# Patient Record
Sex: Female | Born: 1965
Health system: Southern US, Community
[De-identification: ages and names within clinical notes are randomized; demographics above are authoritative.]

## PROBLEM LIST (undated history)

## (undated) DIAGNOSIS — K635 Polyp of colon: Secondary | ICD-10-CM

## (undated) HISTORY — DX: Polyp of colon: K63.5

## (undated) HISTORY — PX: WISDOM TOOTH EXTRACTION: SHX21

---

## 2018-10-19 DIAGNOSIS — Z01419 Encounter for gynecological examination (general) (routine) without abnormal findings: Secondary | ICD-10-CM | POA: Diagnosis not present

## 2018-10-19 DIAGNOSIS — Z1231 Encounter for screening mammogram for malignant neoplasm of breast: Secondary | ICD-10-CM | POA: Diagnosis not present

## 2018-10-19 DIAGNOSIS — Z1389 Encounter for screening for other disorder: Secondary | ICD-10-CM | POA: Diagnosis not present

## 2019-11-08 DIAGNOSIS — Z139 Encounter for screening, unspecified: Secondary | ICD-10-CM | POA: Diagnosis not present

## 2019-11-08 DIAGNOSIS — Z1321 Encounter for screening for nutritional disorder: Secondary | ICD-10-CM | POA: Diagnosis not present

## 2019-11-08 DIAGNOSIS — Z1231 Encounter for screening mammogram for malignant neoplasm of breast: Secondary | ICD-10-CM | POA: Diagnosis not present

## 2019-11-08 DIAGNOSIS — R140 Abdominal distension (gaseous): Secondary | ICD-10-CM | POA: Diagnosis not present

## 2019-11-08 DIAGNOSIS — Z7189 Other specified counseling: Secondary | ICD-10-CM | POA: Diagnosis not present

## 2019-11-08 DIAGNOSIS — Z01419 Encounter for gynecological examination (general) (routine) without abnormal findings: Secondary | ICD-10-CM | POA: Diagnosis not present

## 2019-11-08 DIAGNOSIS — Z1389 Encounter for screening for other disorder: Secondary | ICD-10-CM | POA: Diagnosis not present

## 2020-03-13 ENCOUNTER — Ambulatory Visit (INDEPENDENT_AMBULATORY_CARE_PROVIDER_SITE_OTHER): Payer: BC Managed Care – PPO | Admitting: Family Medicine

## 2020-03-13 ENCOUNTER — Other Ambulatory Visit: Payer: Self-pay

## 2020-03-13 ENCOUNTER — Encounter: Payer: Self-pay | Admitting: Family Medicine

## 2020-03-13 VITALS — BP 110/68 | HR 71 | Temp 97.5°F | Ht 62.5 in | Wt 202.0 lb

## 2020-03-13 DIAGNOSIS — R21 Rash and other nonspecific skin eruption: Secondary | ICD-10-CM | POA: Insufficient documentation

## 2020-03-13 DIAGNOSIS — F418 Other specified anxiety disorders: Secondary | ICD-10-CM

## 2020-03-13 DIAGNOSIS — Z7689 Persons encountering health services in other specified circumstances: Secondary | ICD-10-CM

## 2020-03-13 DIAGNOSIS — R7401 Elevation of levels of liver transaminase levels: Secondary | ICD-10-CM | POA: Diagnosis not present

## 2020-03-13 DIAGNOSIS — R202 Paresthesia of skin: Secondary | ICD-10-CM | POA: Diagnosis not present

## 2020-03-13 DIAGNOSIS — R748 Abnormal levels of other serum enzymes: Secondary | ICD-10-CM | POA: Diagnosis not present

## 2020-03-13 NOTE — Progress Notes (Signed)
Subjective:    Patient ID: Taffi Belgard, female    DOB: 1966/04/15, 54 y.o.   MRN: [id number]  HPI Chief Complaint  Patient presents with  . get established    get established, rash on legs- since early february- comes and goes, not a bad.    She is new to the practice and here to establish care. States she lives in [place] and does not have a PCP.   No regular medical care in years.   Here today with a "skin problem" since early February 2021.   Reports having intermittent rash on bilateral calves without itching.  States the rash only happens after she has been sitting in one particular room in her house.  It does not always happen.  If she does develop a rash then it typically goes away after 5 days without intervention.  States she does not have to use steroids or any topical treatment other than her regular lotion.  She does not have a picture of this. She questions whether she is contagious or not.  States she ordered clothing online in December 2020. States the delivery was "soaking wet", 2 pairs of jeans. States it was raining that day.  Ordered these from [employer].   She was hesitant to try them on and returned them without ever trying them on.   States she was just holding up the jeans and the plastic wrapping was touching her covered legs and she felt like something was biting her on her lower legs. States she had what seemed to be bites on her legs the next day.   States the area clear up quickly but it often can last up to 5 days. Last flareup was on 02/20/2020.   She denies having a rash or any skin issues today.  States her legs have been tingling.  She notices this when the rash is present.   Uses Lysol laundry sometimes but other times she only uses Tide clear and free.   States she sleeps on her couch. States she has insomnia and anxiety.  She has cleaned a lot since   She has discussed this with her exterminator.  Her husband is not having any issues.     States she is anxious about her health.  Denies history of anxiety unless it was related to her health. Denies depression.  Denies fever, chills, dizziness, chest pain, palpitations, shortness of breath, abdominal pain, N/V/D, urinary symptoms, LE edema.   LMP: December 2020 States she is not sexually active.  Thinks she may be going through menopause.  No other concerns today.  Review of Systems Pertinent positives and negatives in the history of present illness.     Objective:   Physical Exam BP 110/68   Pulse 71   Temp (!) 97.5 F (36.4 C)   Ht 5' 2.5" (1.588 m)   Wt 202 lb (91.6 kg)   LMP 11/16/2019   BMI 36.36 kg/m   Alert and in no distress.  Cardiac exam shows regular rate and rhythm and normal heart sounds.  Lungs are clear to auscultation.  Extremities without edema.  Skin is warm and dry, no rash.  She has good distal pulses, normal sensation and movement of her lower extremities and feet.  Negative Homans.       Assessment & Plan:  Complaint of rash  Paresthesias - Plan: CBC with Differential/Platelet, Comprehensive metabolic panel, TSH, T4, free, Vitamin B12  Anxiety about health - Plan: TSH, T4, free  Encounter  to establish care  She is a 54 year old female who is new to the practice and here to establish care.  Complains of an intermittent rash to her bilateral lower extremities after being in a particular room in her house.  The rash goes away without intervention.  She does not have a rash today and does not have a picture of her rash. Recommend that she look for triggers and avoid switching laundry detergents, soaps or lotions. Discussed returning with a picture or with an active rash so that we may get a better clue of what may be happening. She does have some tingling in her lower legs and no lab work in years.  I will check labs today and follow-up. Encouraged her to return for a fasting CPE so that we can get her caught up on preventive health

## 2020-03-14 ENCOUNTER — Encounter: Payer: Self-pay | Admitting: Family Medicine

## 2020-03-14 DIAGNOSIS — R7401 Elevation of levels of liver transaminase levels: Secondary | ICD-10-CM

## 2020-03-14 DIAGNOSIS — R748 Abnormal levels of other serum enzymes: Secondary | ICD-10-CM

## 2020-03-14 HISTORY — DX: Abnormal levels of other serum enzymes: R74.8

## 2020-03-14 HISTORY — DX: Elevation of levels of liver transaminase levels: R74.01

## 2020-03-14 LAB — CBC WITH DIFFERENTIAL/PLATELET
Basophils Absolute: 0.1 10*3/uL (ref 0.0–0.2)
Basos: 1 %
EOS (ABSOLUTE): 0.1 10*3/uL (ref 0.0–0.4)
Eos: 1 %
Hematocrit: 42.3 % (ref 34.0–46.6)
Hemoglobin: 14.1 g/dL (ref 11.1–15.9)
Immature Grans (Abs): 0 10*3/uL (ref 0.0–0.1)
Immature Granulocytes: 0 %
Lymphocytes Absolute: 2.5 10*3/uL (ref 0.7–3.1)
Lymphs: 23 %
MCH: 30.3 pg (ref 26.6–33.0)
MCHC: 33.3 g/dL (ref 31.5–35.7)
MCV: 91 fL (ref 79–97)
Monocytes Absolute: 0.7 10*3/uL (ref 0.1–0.9)
Monocytes: 7 %
Neutrophils Absolute: 7.5 10*3/uL — ABNORMAL HIGH (ref 1.4–7.0)
Neutrophils: 68 %
Platelets: 414 10*3/uL (ref 150–450)
RBC: 4.66 x10E6/uL (ref 3.77–5.28)
RDW: 12.9 % (ref 11.7–15.4)
WBC: 11 10*3/uL — ABNORMAL HIGH (ref 3.4–10.8)

## 2020-03-14 LAB — COMPREHENSIVE METABOLIC PANEL
ALT: 46 IU/L — ABNORMAL HIGH (ref 0–32)
AST: 33 IU/L (ref 0–40)
Albumin/Globulin Ratio: 1.4 (ref 1.2–2.2)
Albumin: 4.5 g/dL (ref 3.8–4.9)
Alkaline Phosphatase: 165 IU/L — ABNORMAL HIGH (ref 39–117)
BUN/Creatinine Ratio: 16 (ref 9–23)
BUN: 12 mg/dL (ref 6–24)
Bilirubin Total: 0.5 mg/dL (ref 0.0–1.2)
CO2: 22 mmol/L (ref 20–29)
Calcium: 10.1 mg/dL (ref 8.7–10.2)
Chloride: 103 mmol/L (ref 96–106)
Creatinine, Ser: 0.73 mg/dL (ref 0.57–1.00)
GFR calc Af Amer: 110 mL/min/{1.73_m2} (ref >59)
GFR calc non Af Amer: 96 mL/min/{1.73_m2} (ref >59)
Globulin, Total: 3.2 g/dL (ref 1.5–4.5)
Glucose: 98 mg/dL (ref 65–99)
Potassium: 4.1 mmol/L (ref 3.5–5.2)
Sodium: 141 mmol/L (ref 134–144)
Total Protein: 7.7 g/dL (ref 6.0–8.5)

## 2020-03-14 LAB — VITAMIN B12: Vitamin B-12: 415 pg/mL (ref 232–1245)

## 2020-03-14 LAB — T4, FREE: Free T4: 1.01 ng/dL (ref 0.82–1.77)

## 2020-03-14 LAB — TSH: TSH: 2.85 u[IU]/mL (ref 0.450–4.500)

## 2020-03-14 NOTE — Progress Notes (Signed)
Please see if Verdis Frederickson can add an acute hepatitis panel due to elevated ALT and fractionate the alkaline phosphatase due to elevated alk phos.

## 2020-03-16 ENCOUNTER — Encounter: Payer: Self-pay | Admitting: Family Medicine

## 2020-03-16 ENCOUNTER — Ambulatory Visit (INDEPENDENT_AMBULATORY_CARE_PROVIDER_SITE_OTHER): Payer: BC Managed Care – PPO | Admitting: Family Medicine

## 2020-03-16 ENCOUNTER — Other Ambulatory Visit: Payer: Self-pay

## 2020-03-16 VITALS — BP 120/72 | HR 62 | Temp 97.7°F

## 2020-03-16 DIAGNOSIS — R7401 Elevation of levels of liver transaminase levels: Secondary | ICD-10-CM

## 2020-03-16 DIAGNOSIS — R748 Abnormal levels of other serum enzymes: Secondary | ICD-10-CM

## 2020-03-16 DIAGNOSIS — R21 Rash and other nonspecific skin eruption: Secondary | ICD-10-CM | POA: Diagnosis not present

## 2020-03-16 DIAGNOSIS — R899 Unspecified abnormal finding in specimens from other organs, systems and tissues: Secondary | ICD-10-CM

## 2020-03-16 NOTE — Progress Notes (Signed)
These were discussed in person

## 2020-03-16 NOTE — Progress Notes (Signed)
   Subjective:    Patient ID: Heather Whitney, female    DOB: Jul 25, 1966, 54 y.o.   MRN: [id number]  HPI Chief Complaint  Patient presents with  . rash on leg    rash on legs   She is here with complaints of a non pruritic rash which returned on her lower legs yesterday.  She sent me a picture via email which I did view. She had several red, flat non specific types of dots on her left lower leg. States it is already fading again. No known triggers. She has not seen any insects or bugs. No one else in her house has this problem. 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   No rash elsewhere. No arthralgias or myalgias.   Discussed abnormal labs.  Mildly elevated alk phos and I am awaiting fractionation of this.  Elevated ALT   She takes Advil only once weekly  No alcohol use.   Reviewed allergies, medications, past medical, surgical, family, and social history.    Review of Systems Pertinent positives and negatives in the history of present illness.     Objective:   Physical Exam BP 120/72   Pulse 62   Temp 97.7 F (36.5 C)   Alert and oriented and in no acute distress.  Left lower leg with 4-5 small areas of red, flat, nonpruritic spots.  They have faded compared to the picture of them yesterday.      Assessment & Plan:  Rash and nonspecific skin eruption  Abnormal laboratory test result  Elevated alkaline phosphatase level - Plan: Comprehensive metabolic panel, Acute Hep Panel & Hep B Surface Ab  Elevated ALT measurement - Plan: Comprehensive metabolic panel, Acute Hep Panel & Hep B Surface Ab  Discussed that the rash is already fading.  I do not suspect that this is an insect or bug bite.  Suspect that she is coming in contact with something that is causing this.  Recommend that she keep an eye on any possible triggers.  She does plan to  see a dermatologist in the next few weeks as well.  Encouraged her to take a picture each time she had an outbreak.  We discussed abnormal lab values in person. Her ALT and alkaline phosphatase are mildly elevated.  I will check these again and also check an acute hepatitis panel.  I am also waiting for the results of her fractionated alk phos from 2 days ago. She will follow-up for a CPE at her convenience

## 2020-03-17 LAB — COMPREHENSIVE METABOLIC PANEL
ALT: 63 IU/L — ABNORMAL HIGH (ref 0–32)
AST: 37 IU/L (ref 0–40)
Albumin/Globulin Ratio: 1.4 (ref 1.2–2.2)
Albumin: 4.3 g/dL (ref 3.8–4.9)
Alkaline Phosphatase: 159 IU/L — ABNORMAL HIGH (ref 39–117)
BUN/Creatinine Ratio: 13 (ref 9–23)
BUN: 10 mg/dL (ref 6–24)
Bilirubin Total: 0.4 mg/dL (ref 0.0–1.2)
CO2: 22 mmol/L (ref 20–29)
Calcium: 9.6 mg/dL (ref 8.7–10.2)
Chloride: 105 mmol/L (ref 96–106)
Creatinine, Ser: 0.76 mg/dL (ref 0.57–1.00)
GFR calc Af Amer: 105 mL/min/{1.73_m2} (ref >59)
GFR calc non Af Amer: 91 mL/min/{1.73_m2} (ref >59)
Globulin, Total: 3 g/dL (ref 1.5–4.5)
Glucose: 93 mg/dL (ref 65–99)
Potassium: 4 mmol/L (ref 3.5–5.2)
Sodium: 142 mmol/L (ref 134–144)
Total Protein: 7.3 g/dL (ref 6.0–8.5)

## 2020-03-17 LAB — ACUTE HEP PANEL AND HEP B SURFACE AB
Hep A IgM: NEGATIVE
Hep B C IgM: NEGATIVE
Hep C Virus Ab: <0.1 s/co ratio (ref 0.0–0.9)
Hepatitis B Surf Ab Quant: <3.1 m[IU]/mL — ABNORMAL LOW (ref >9.9)
Hepatitis B Surface Ag: NEGATIVE

## 2020-03-20 LAB — HEPATITIS PANEL, ACUTE

## 2020-03-20 LAB — SPECIMEN STATUS REPORT

## 2020-03-20 LAB — ALKALINE PHOSPHATASE, ISOENZYMES: Alkaline Phosphatase: 160 IU/L — ABNORMAL HIGH (ref 39–117)

## 2020-03-21 NOTE — Progress Notes (Signed)
She is not immune to hepatitis B. I recommend that she get the hepatitis B vaccine series to protect her in the future.

## 2020-03-22 ENCOUNTER — Other Ambulatory Visit: Payer: Self-pay

## 2020-03-22 ENCOUNTER — Other Ambulatory Visit (INDEPENDENT_AMBULATORY_CARE_PROVIDER_SITE_OTHER): Payer: BC Managed Care – PPO

## 2020-03-22 DIAGNOSIS — Z23 Encounter for immunization: Secondary | ICD-10-CM

## 2020-03-25 LAB — ALKALINE PHOSPHATASE, ISOENZYMES
Alkaline Phosphatase: 161 IU/L — ABNORMAL HIGH (ref 39–117)
BONE FRACTION: 32 % (ref 14–68)
INTESTINAL FRAC.: 0 % (ref 0–18)
LIVER FRACTION: 68 % (ref 18–85)

## 2020-03-25 LAB — SPECIMEN STATUS REPORT

## 2020-03-26 NOTE — Progress Notes (Signed)
Her alkaline phosphatase lab value is elevated and the labs did not show a specific reason. I recommend she return in 1-2 months for a fasting CPE and we will recheck her liver tests

## 2020-03-28 DIAGNOSIS — L820 Inflamed seborrheic keratosis: Secondary | ICD-10-CM | POA: Diagnosis not present

## 2020-03-28 DIAGNOSIS — L918 Other hypertrophic disorders of the skin: Secondary | ICD-10-CM | POA: Diagnosis not present

## 2020-03-28 DIAGNOSIS — I872 Venous insufficiency (chronic) (peripheral): Secondary | ICD-10-CM | POA: Diagnosis not present

## 2020-04-24 ENCOUNTER — Other Ambulatory Visit (INDEPENDENT_AMBULATORY_CARE_PROVIDER_SITE_OTHER): Payer: BC Managed Care – PPO

## 2020-04-24 ENCOUNTER — Other Ambulatory Visit: Payer: BC Managed Care – PPO

## 2020-04-24 ENCOUNTER — Other Ambulatory Visit: Payer: Self-pay

## 2020-04-24 DIAGNOSIS — Z23 Encounter for immunization: Secondary | ICD-10-CM | POA: Diagnosis not present

## 2020-04-24 DIAGNOSIS — L739 Follicular disorder, unspecified: Secondary | ICD-10-CM | POA: Diagnosis not present

## 2020-04-25 NOTE — Patient Instructions (Addendum)
Obgyn Offices:   [facility] [address] [place], [place] [phone]  [facility] Address: [address] [place], [place] [address] Phone: [phone]  [facility] [address] [place], [place] [address] Phone: [phone]  [place], [place] [address] Phone: [phone]  Return for your Tdap (Tetanus, diptheria, and pertussis) vaccine at least one month after your upcoming Covid vaccine.    Preventive Care 82-52 Years Old, Female Preventive care refers to visits with your health care provider and lifestyle choices that can promote health and wellness. This includes:  A yearly physical exam. This may also be called an annual well check.  Regular dental visits and eye exams.  Immunizations.  Screening for certain conditions.  Healthy lifestyle choices, such as eating a healthy diet, getting regular exercise, not using drugs or products that contain nicotine and tobacco, and limiting alcohol use. What can I expect for my preventive care visit? Physical exam Your health care provider will check your:  Height and weight. This may be used to calculate body mass index (BMI), which tells if you are at a healthy weight.  Heart rate and blood pressure.  Skin for abnormal spots. Counseling Your health care provider may ask you questions about your:  Alcohol, tobacco, and drug use.  Emotional well-being.  Home and relationship well-being.  Sexual activity.  Eating habits.  Work and work Statistician.  Method of birth control.  Menstrual cycle.  Pregnancy history. What immunizations do I need?  Influenza (flu) vaccine  This is recommended every year. Tetanus, diphtheria, and pertussis (Tdap) vaccine  You may need a Td booster every 10 years. Varicella (chickenpox) vaccine  You may need this if you have not been  vaccinated. Zoster (shingles) vaccine  You may need this after age 8. Measles, mumps, and rubella (MMR) vaccine  You may need at least one dose of MMR if you were born in 1957 or later. You may also need a second dose. Pneumococcal conjugate (PCV13) vaccine  You may need this if you have certain conditions and were not previously vaccinated. Pneumococcal polysaccharide (PPSV23) vaccine  You may need one or two doses if you smoke cigarettes or if you have certain conditions. Meningococcal conjugate (MenACWY) vaccine  You may need this if you have certain conditions. Hepatitis A vaccine  You may need this if you have certain conditions or if you travel or work in places where you may be exposed to hepatitis A. Hepatitis B vaccine  You may need this if you have certain conditions or if you travel or work in places where you may be exposed to hepatitis B. Haemophilus influenzae type b (Hib) vaccine  You may need this if you have certain conditions. Human papillomavirus (HPV) vaccine  If recommended by your health care provider, you may need three doses over 6 months. You may receive vaccines as individual doses or as more than one vaccine together in one shot (combination vaccines). Talk with your health care provider about the risks and benefits of combination vaccines. What tests do I need? Blood tests  Lipid and cholesterol levels. These may be checked every 5 years, or more frequently if you are over 67 years old.  Hepatitis C test.  Hepatitis B test. Screening  Lung cancer screening. You may have this screening every year starting at age 28 if you have a 30-pack-year history of smoking and currently smoke or  have quit within the past 15 years.  Colorectal cancer screening. All adults should have this screening starting at age 67 and continuing until age 72. Your health care provider may recommend screening at age 16 if you are at increased risk. You will have tests every  1-10 years, depending on your results and the type of screening test.  Diabetes screening. This is done by checking your blood sugar (glucose) after you have not eaten for a while (fasting). You may have this done every 1-3 years.  Mammogram. This may be done every 1-2 years. Talk with your health care provider about when you should start having regular mammograms. This may depend on whether you have a family history of breast cancer.  BRCA-related cancer screening. This may be done if you have a family history of breast, ovarian, tubal, or peritoneal cancers.  Pelvic exam and Pap test. This may be done every 3 years starting at age 24. Starting at age 34, this may be done every 5 years if you have a Pap test in combination with an HPV test. Other tests  Sexually transmitted disease (STD) testing.  Bone density scan. This is done to screen for osteoporosis. You may have this scan if you are at high risk for osteoporosis. Follow these instructions at home: Eating and drinking  Eat a diet that includes fresh fruits and vegetables, whole grains, lean protein, and low-fat dairy.  Take vitamin and mineral supplements as recommended by your health care provider.  Do not drink alcohol if: ? Your health care provider tells you not to drink. ? You are pregnant, may be pregnant, or are planning to become pregnant.  If you drink alcohol: ? Limit how much you have to 0-1 drink a day. ? Be aware of how much alcohol is in your drink. In the [place] one drink equals one 12 oz bottle of beer (355 mL), one 5 oz glass of wine (148 mL), or one 1 oz glass of hard liquor (44 mL). Lifestyle  Take daily care of your teeth and gums.  Stay active. Exercise for at least 30 minutes on 5 or more days each week.  Do not use any products that contain nicotine or tobacco, such as cigarettes, e-cigarettes, and chewing tobacco. If you need help quitting, ask your health care provider.  If you are sexually active,  practice safe sex. Use a condom or other form of birth control (contraception) in order to prevent pregnancy and STIs (sexually transmitted infections).  If told by your health care provider, take low-dose aspirin daily starting at age 44. What's next?  Visit your health care provider once a year for a well check visit.  Ask your health care provider how often you should have your eyes and teeth checked.  Stay up to date on all vaccines. This information is not intended to replace advice given to you by your health care provider. Make sure you discuss any questions you have with your health care provider. Document Revised: 08/13/2018 Document Reviewed: 08/13/2018 Elsevier Patient Education  2020 [employer].

## 2020-04-25 NOTE — Progress Notes (Signed)
Subjective:    Patient ID: Heather Whitney, female    DOB: 27-Jun-1966, 54 y.o.   MRN: [id number]  HPI Chief Complaint  Patient presents with  . fasting cpe    fasting cpe, has obgyn, no other concerns   She is here for a complete physical exam. Last CPE: 10 years ago  Deficient in areas of preventive health.   Other providers: OB/GYN  -In [place].   She was recently found to not have immunity to hepatitis B and is in the process of repeating the series. Elevated ALT and alk phos with neg acute hepatitis panel.   States she takes Advil 1 tablet approximately 2 per week total on average.  Drinks 3 alcoholic beverages per year typically   States she has a history of vitamin D def. Is not currently taking a supplement.   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  Diet: fairly healthy. Drinking more water. Drinks soda.  Excerise: none. Plans to start walking soon.   Immunizations: J & J vaccine scheduled for June 1st.    Health maintenance:  Mammogram: November 2020  Colonoscopy: never  Last Gynecological Exam: November 2020  Last Menstrual cycle: December 2020  Has never been pregnant  Last Dental Exam: has appt tomorrow for cleaning  Last Eye Exam: March 29, 2020   Wears seatbelt always, uses sunscreen, smoke detectors in home and functioning, does not text while driving and feels safe in home environment.   Reviewed allergies, medications, past medical, surgical, family, and social history.  Review of Systems Review of Systems Constitutional: -fever, -chills, -sweats, -unexpected weight change,-fatigue ENT: -runny nose, -ear pain, -sore throat Cardiology:  -chest pain, -palpitations, -edema Respiratory: -cough, -shortness of breath, -wheezing Gastroenterology: -abdominal pain, -nausea, -vomiting, -diarrhea, -constipation  Hematology: -bleeding or bruising problems Musculoskeletal: -arthralgias, -myalgias, -joint  swelling, -back pain Ophthalmology: -vision changes Urology: -dysuria, -difficulty urinating, -hematuria, -urinary frequency, -urgency Neurology: -headache, -weakness, -tingling, -numbness       Objective:   Physical Exam BP 110/72   Pulse 68   Ht 5' 4"  (1.626 m)   Wt 200 lb 3.2 oz (90.8 kg)   SpO2 99%   BMI 34.36 kg/m   General Appearance:    Alert, cooperative, no distress, appears stated age  Head:    Normocephalic, without obvious abnormality, atraumatic  Eyes:    PERRL, conjunctiva/corneas clear, EOM's intact   Ears:    Normal TM's and external ear canals  Nose:   Mask in place   Throat:   Mask in place   Neck:   Supple, no lymphadenopathy;  thyroid:  no   enlargement/tenderness/nodules; no JVD  Back:    Spine nontender, no curvature, ROM normal, no CVA     tenderness  Lungs:     Clear to auscultation bilaterally without wheezes, rales or     ronchi; respirations unlabored  Chest Wall:    No tenderness or deformity   Heart:    Regular rate and rhythm, S1 and S2 normal, no murmur, rub   or gallop  Breast Exam:    OB/GYN  Abdomen:     Soft, non-tender, nondistended, normoactive bowel sounds,    no masses, no hepatosplenomegaly  Genitalia:    OB/GYN     Extremities:   No clubbing, cyanosis or edema  Pulses:   2+ and symmetric all extremities  Skin:   Skin color, texture, turgor normal, no rashes or lesions  Lymph  nodes:   Cervical, supraclavicular, and axillary nodes normal  Neurologic:   CNII-XII intact, normal strength, sensation and gait; reflexes 2+ and symmetric throughout          Psych:   Normal mood, affect, hygiene and grooming.         Assessment & Plan:  Routine general medical examination at a health care facility - Plan: Lipid panel -She is fairly new to me.  Here today for fasting CPE.  Preventive healthcare reviewed and she is deficient in several areas.  Reports being up-to-date with mammogram and Pap smear done at her OB/GYN office last year in  [place].  I will request these records.  She is never had a colonoscopy and I will refer her to GI.  Counseling on healthy lifestyle including diet and exercise.  Immunizations reviewed.  She plans to get a Covid vaccine in the next couple of weeks.  She is overdue for Tdap and will return for nurse visit at least 2 to 4 weeks after getting her last Covid vaccine.  Elevated alkaline phosphatase level - Plan: Ambulatory referral to Gastroenterology, Hepatic Function Panel -Normal fractionated alk phos.  Appreciate GI opinion regarding alk phos level.  Elevated ALT measurement - Plan: Ambulatory referral to Gastroenterology, Hepatic Function Panel -Denies regular NSAID or alcohol use.  Discussed low-fat diet and weight loss.  Appreciate GI opinion regarding elevated ALT.  Screen for colon cancer - Plan: Ambulatory referral to Gastroenterology -Referral made for her for screening colonoscopy per guidelines  Vitamin D deficiency - Plan: VITAMIN D 25 Hydroxy (Vit-D Deficiency, Fractures) -Check vitamin D level and start her on a vitamin D supplement as appropriate.  Obesity (BMI 30-39.9) -Counseling on healthy diet and exercise

## 2020-04-26 ENCOUNTER — Ambulatory Visit (INDEPENDENT_AMBULATORY_CARE_PROVIDER_SITE_OTHER): Payer: BC Managed Care – PPO | Admitting: Family Medicine

## 2020-04-26 ENCOUNTER — Other Ambulatory Visit: Payer: Self-pay

## 2020-04-26 ENCOUNTER — Encounter: Payer: Self-pay | Admitting: Family Medicine

## 2020-04-26 VITALS — BP 110/72 | HR 68 | Ht 64.0 in | Wt 200.2 lb

## 2020-04-26 DIAGNOSIS — Z0000 Encounter for general adult medical examination without abnormal findings: Secondary | ICD-10-CM | POA: Diagnosis not present

## 2020-04-26 DIAGNOSIS — R7401 Elevation of levels of liver transaminase levels: Secondary | ICD-10-CM | POA: Diagnosis not present

## 2020-04-26 DIAGNOSIS — E559 Vitamin D deficiency, unspecified: Secondary | ICD-10-CM | POA: Diagnosis not present

## 2020-04-26 DIAGNOSIS — Z1211 Encounter for screening for malignant neoplasm of colon: Secondary | ICD-10-CM

## 2020-04-26 DIAGNOSIS — R748 Abnormal levels of other serum enzymes: Secondary | ICD-10-CM

## 2020-04-26 DIAGNOSIS — E669 Obesity, unspecified: Secondary | ICD-10-CM

## 2020-04-27 ENCOUNTER — Telehealth: Payer: Self-pay | Admitting: Internal Medicine

## 2020-04-27 LAB — LIPID PANEL
Chol/HDL Ratio: 3.9 ratio (ref 0.0–4.4)
Cholesterol, Total: 181 mg/dL (ref 100–199)
HDL: 47 mg/dL (ref >39)
LDL Chol Calc (NIH): 119 mg/dL — ABNORMAL HIGH (ref 0–99)
Triglycerides: 78 mg/dL (ref 0–149)
VLDL Cholesterol Cal: 15 mg/dL (ref 5–40)

## 2020-04-27 LAB — HEPATIC FUNCTION PANEL
ALT: 33 IU/L — ABNORMAL HIGH (ref 0–32)
AST: 26 IU/L (ref 0–40)
Albumin: 4.2 g/dL (ref 3.8–4.9)
Alkaline Phosphatase: 180 IU/L — ABNORMAL HIGH (ref 39–117)
Bilirubin Total: 0.3 mg/dL (ref 0.0–1.2)
Bilirubin, Direct: 0.09 mg/dL (ref 0.00–0.40)
Total Protein: 7.3 g/dL (ref 6.0–8.5)

## 2020-04-27 LAB — VITAMIN D 25 HYDROXY (VIT D DEFICIENCY, FRACTURES): Vit D, 25-Hydroxy: 35.4 ng/mL (ref 30.0–100.0)

## 2020-04-27 NOTE — Telephone Encounter (Signed)
Pt called and states she did not sign a records release for obgyn- PAP or MAMMOGRAM as she does not want [place] to have records of that and she does not want to be asked for it in the future as she will not release that information to [place]. I advised her that we would need to close the gaps in her charts and she still refused. FYI

## 2020-04-27 NOTE — Telephone Encounter (Signed)
I will not be able to close gaps and ensure she is up to date on cancer screenings. Ok to document that these are done and that she reports them as normal.

## 2020-04-27 NOTE — Telephone Encounter (Signed)
Please advise as to your thoughts regarding patient not wanting to release medical records for her Pap smear and mammogram to me.  I recommend that we get her back for a Pap smear and send her for mammogram or I can give her a list of OB/GYN offices if she prefers to make sure she is up-to-date with her cancer screening.

## 2020-04-27 NOTE — Progress Notes (Signed)
Please schedule her for a RUQ [place] due to elevated liver function tests. Also, I need her to see GI for her elevated alkaline phosphatase and not just for a screening colonoscopy. I referred her yesterday. You may want to see if she is agreeable to getting the liver US before scheduling it. Thanks.  Her vitamin D is in low normal range. I recommend taking a multi-vitamin with D.

## 2020-04-27 NOTE — Telephone Encounter (Signed)
Pt states she has an obgyn and was up to date on her pap smear and she had her mammogram done at obgyn office

## 2020-04-27 NOTE — Telephone Encounter (Signed)
This has already been noted in health maintenance

## 2020-05-11 ENCOUNTER — Encounter: Payer: Self-pay | Admitting: Gastroenterology

## 2020-05-12 ENCOUNTER — Encounter: Payer: Self-pay | Admitting: Gastroenterology

## 2020-07-05 ENCOUNTER — Ambulatory Visit (INDEPENDENT_AMBULATORY_CARE_PROVIDER_SITE_OTHER): Payer: BC Managed Care – PPO | Admitting: Gastroenterology

## 2020-07-05 ENCOUNTER — Encounter: Payer: Self-pay | Admitting: Gastroenterology

## 2020-07-05 ENCOUNTER — Other Ambulatory Visit (INDEPENDENT_AMBULATORY_CARE_PROVIDER_SITE_OTHER): Payer: BC Managed Care – PPO

## 2020-07-05 VITALS — BP 128/62 | HR 65 | Ht 64.0 in | Wt 202.0 lb

## 2020-07-05 DIAGNOSIS — R7989 Other specified abnormal findings of blood chemistry: Secondary | ICD-10-CM

## 2020-07-05 DIAGNOSIS — Z1211 Encounter for screening for malignant neoplasm of colon: Secondary | ICD-10-CM

## 2020-07-05 DIAGNOSIS — Z7689 Persons encountering health services in other specified circumstances: Secondary | ICD-10-CM

## 2020-07-05 LAB — GLUCOSE, RANDOM: Glucose, Bld: 100 mg/dL — ABNORMAL HIGH (ref 70–99)

## 2020-07-05 LAB — IRON: Iron: 52 ug/dL (ref 42–145)

## 2020-07-05 LAB — FERRITIN: Ferritin: 89.8 ng/mL (ref 10.0–291.0)

## 2020-07-05 MED ORDER — SUPREP BOWEL PREP KIT 17.5-3.13-1.6 GM/177ML PO SOLN
1.0000 | ORAL | 0 refills | Status: DC
Start: 2020-07-05 — End: 2020-07-12

## 2020-07-05 NOTE — Patient Instructions (Signed)
Your provider has requested that you go to the basement level for lab work before leaving today. Press "B" on the elevator. The lab is located at the first door on the left as you exit the elevator.  You have been scheduled for an abdominal ultrasound at [facility] (1st floor of hospital) on 07/10/20 at 10:00am. Please arrive 15 minutes prior to your appointment for registration. Make certain not to have anything to eat or drink after midnight the night before your appointment. Should you need to reschedule your appointment, please contact radiology at [phone]. This test typically takes about 30 minutes to perform.  You have been scheduled for a colonoscopy. Please follow written instructions given to you at your visit today.  Please pick up your prep supplies at the pharmacy within the next 1-3 days. If you use inhalers (even only as needed), please bring them with you on the day of your procedure.  If you are age 52 or older, your body mass index should be between 23-30. Your Body mass index is 34.67 kg/m. If this is out of the aforementioned range listed, please consider follow up with your Primary Care Provider.  If you are age 59 or younger, your body mass index should be between 19-25. Your Body mass index is 34.67 kg/m. If this is out of the aformentioned range listed, please consider follow up with your Primary Care Provider.   We have placed a referral for primary care. Their office will contact you with an appointment.   Thank you for choosing me and [facility].  Dr.Kimberly Beavers

## 2020-07-05 NOTE — Progress Notes (Signed)
Referring Provider: Girtha Rm, NP-C Primary Care Physician:  Patient, No Pcp Per  Reason for Consultation:  Elevated LFTs   IMPRESSION:  Abnormal liver enzymes - elevated alk phos and ALT    - normal alk phos isoenzymes Bloating No prior colon cancer screening No prior family history of colon cancer or polyps  Abnormal liver enzymes: Suspected fatty liver. Labs today to evaluate for other common causes of hepatocellular and cholestatic inflammation. Abdominal ultrasound to evaluate the hepatic parenchyma.  Colonoscopy recommended for colon cancer screening.     PLAN: - Fasting labs: Hepatitis C antibody, hepatitis B surface antigen, hepatitis B core antibody, fasting ferritin, fasting insulin, fasting glucose, iron, ANA, AMA, anti-smooth muscle antibody, IgG, IgM - Abdominal ultrasound - Screening colonoscopy - Referral to [facility] to establish PMD  Please see the "Patient Instructions" section for addition details about the plan.  HPI: Heather Whitney is a 54 y.o. female referred by NP [facility] for abnormal liver enzymes and colon cancer screening. The history is obtained through the patient and review of her electronic health record. Denies chronic medical problems.  Has gained weight during Covid.  Completed Covid June 2021.   Recently seen by NP [facility] for an acute rash vusut. During her evaluation, she was found to have elevated liver enzymes.  No associated symptoms.  No prior blood donation.  No prior blood transfusion.  No history of jaundice or scleral icterus.  No history of use or experimentation with IV or intranasal street drugs.  No history of autoimmune disease.  No family history of liver disease.  Recent labs: 03/13/20: AST 33, ALT 46, alk phos 165, TB 0.5 03/16/20: AST 37, ALT 63, alk phos 161, TB 0.4 03/16/20: alk phos isoenzymes normal with bone 32%, intestinal 0%, liver 68% 04/26/20: AST 26, ALT 33, alk phos 180, TB 0.3  No prior abdominal  imaging.   Reported bloating and reflux. Tries to follow a bland diet due to a long history of "sensitve stomach." Heartburn has improved. Previously used Prilosec OTC. Attributes bloating to soda. When she drinks water she feels better.   No change in bowel habits, diarrhea, constipation, blood in the stool.   No prior endoscopic evaluation.   No known family history of colon cancer or polyps. No family history of uterine/endometrial cancer, pancreatic cancer or gastric/stomach cancer.   Past Medical History:  Diagnosis Date  . Elevated alkaline phosphatase level 03/14/2020  . Elevated ALT measurement 03/14/2020    Past Surgical History:  Procedure Laterality Date  . WISDOM TOOTH EXTRACTION      No current outpatient medications on file.   No current facility-administered medications for this visit.    Allergies as of 07/05/2020  . (No Known Allergies)    Family History  Problem Relation Age of Onset  . Hypertension Paternal Grandmother     Social History   Socioeconomic History  . Marital status: Married    Spouse name: Not on file  . Number of children: Not on file  . Years of education: Not on file  . Highest education level: Not on file  Occupational History  . Not on file  Tobacco Use  . Smoking status: Never Smoker  . Smokeless tobacco: Never Used  Substance and Sexual Activity  . Alcohol use: Yes    Comment: occasional   . Drug use: Never  . Sexual activity: Not on file  Other Topics Concern  . Not on file  Social History Narrative  .  Not on file   Social Determinants of Health   Financial Resource Strain:   . Difficulty of Paying Living Expenses:   Food Insecurity:   . Worried About Charity fundraiser in the Last Year:   . Arboriculturist in the Last Year:   Transportation Needs:   . Film/video editor (Medical):   Marland Kitchen Lack of Transportation (Non-Medical):   Physical Activity:   . Days of Exercise per Week:   . Minutes of Exercise per  Session:   Stress:   . Feeling of Stress :   Social Connections:   . Frequency of Communication with Friends and Family:   . Frequency of Social Gatherings with Friends and Family:   . Attends Religious Services:   . Active Member of Clubs or Organizations:   . Attends Archivist Meetings:   Marland Kitchen Marital Status:   Intimate Partner Violence:   . Fear of Current or Ex-Partner:   . Emotionally Abused:   Marland Kitchen Physically Abused:   . Sexually Abused:     Review of Systems: 12 system ROS is negative except as noted above with the addition of allergies and fatigue.   Physical Exam: General:   Alert,  well-nourished, pleasant and cooperative in NAD Head:  Normocephalic and atraumatic. Eyes:  Sclera clear, no icterus.   Conjunctiva pink. Ears:  Normal auditory acuity. Nose:  No deformity, discharge,  or lesions. Mouth:  No deformity or lesions.   Neck:  Supple; no masses or thyromegaly. Lungs:  Clear throughout to auscultation.   No wheezes. Heart:  Regular rate and rhythm; no murmurs. Abdomen:  Soft,nontender, nondistended, normal bowel sounds, no rebound or guarding. No hepatosplenomegaly.   Rectal:  Deferred  Msk:  Symmetrical. No boney deformities LAD: No inguinal or umbilical LAD Extremities:  No clubbing or edema. Neurologic:  Alert and  oriented x4;  grossly nonfocal Skin:  Intact without significant lesions or rashes. Psych:  Alert and cooperative. Normal mood and affect.   Heather Whitney L. Tarri Glenn, MD, MPH 07/05/2020, 10:33 AM

## 2020-07-10 ENCOUNTER — Other Ambulatory Visit: Payer: Self-pay

## 2020-07-10 ENCOUNTER — Ambulatory Visit (HOSPITAL_COMMUNITY)
Admission: RE | Admit: 2020-07-10 | Discharge: 2020-07-10 | Disposition: A | Discharge status: Home / Self Care | Payer: BC Managed Care – PPO | Source: Ambulatory Visit | Attending: Gastroenterology | Admitting: Gastroenterology

## 2020-07-10 DIAGNOSIS — R7989 Other specified abnormal findings of blood chemistry: Secondary | ICD-10-CM

## 2020-07-10 DIAGNOSIS — Z1211 Encounter for screening for malignant neoplasm of colon: Secondary | ICD-10-CM | POA: Diagnosis not present

## 2020-07-10 DIAGNOSIS — Q8909 Congenital malformations of spleen: Secondary | ICD-10-CM | POA: Diagnosis not present

## 2020-07-10 DIAGNOSIS — K7689 Other specified diseases of liver: Secondary | ICD-10-CM | POA: Diagnosis not present

## 2020-07-10 DIAGNOSIS — D7389 Other diseases of spleen: Secondary | ICD-10-CM | POA: Diagnosis not present

## 2020-07-12 ENCOUNTER — Other Ambulatory Visit: Payer: Self-pay

## 2020-07-12 ENCOUNTER — Ambulatory Visit (AMBULATORY_SURGERY_CENTER): Payer: BC Managed Care – PPO | Admitting: Gastroenterology

## 2020-07-12 ENCOUNTER — Encounter: Payer: Self-pay | Admitting: Gastroenterology

## 2020-07-12 VITALS — BP 123/83 | HR 57 | Temp 97.5°F | Resp 24 | Ht 64.0 in | Wt 202.0 lb

## 2020-07-12 DIAGNOSIS — Z1211 Encounter for screening for malignant neoplasm of colon: Secondary | ICD-10-CM | POA: Diagnosis not present

## 2020-07-12 DIAGNOSIS — D120 Benign neoplasm of cecum: Secondary | ICD-10-CM

## 2020-07-12 DIAGNOSIS — R7989 Other specified abnormal findings of blood chemistry: Secondary | ICD-10-CM

## 2020-07-12 MED ORDER — SODIUM CHLORIDE 0.9 % IV SOLN: 500.0000 mL | Freq: Once | INTRAVENOUS | Status: DC

## 2020-07-12 NOTE — Progress Notes (Signed)
Report to PACU, RN, vss, BBS= Clear.  

## 2020-07-12 NOTE — Progress Notes (Signed)
Called to room to assist during endoscopic procedure.  Patient ID and intended procedure confirmed with present staff. Received instructions for my participation in the procedure from the performing physician.  

## 2020-07-12 NOTE — Op Note (Signed)
[place] Patient Name: Heather Whitney Procedure Date: 07/12/2020 11:08 AM MRN: [id number] Endoscopist: Thornton Park MD, MD Age: 54 Referring MD:  Date of Birth: 14-Feb-1966 Gender: Female Account #: [id number] Procedure:                Colonoscopy Indications:              Screening for colorectal malignant neoplasm, This                            is the patient's first colonoscopy                           No known family history of colon cancer or polyps Medicines:                Monitored Anesthesia Care Procedure:                Pre-Anesthesia Assessment:                           - Prior to the procedure, a History and Physical                            was performed, and patient medications and                            allergies were reviewed. The patient's tolerance of                            previous anesthesia was also reviewed. The risks                            and benefits of the procedure and the sedation                            options and risks were discussed with the patient.                            All questions were answered, and informed consent                            was obtained. Prior Anticoagulants: The patient has                            taken no previous anticoagulant or antiplatelet                            agents. ASA Grade Assessment: II - A patient with                            mild systemic disease. After reviewing the risks                            and benefits, the patient was deemed in  satisfactory condition to undergo the procedure.                           After obtaining informed consent, the colonoscope                            was passed under direct vision. Throughout the                            procedure, the patient's blood pressure, pulse, and                            oxygen saturations were monitored continuously. The                            Colonoscope was  introduced through the anus and                            advanced to the 3 cm into the ileum. The                            colonoscopy was performed without difficulty. The                            patient tolerated the procedure well. The quality                            of the bowel preparation was good. The terminal                            ileum, ileocecal valve, appendiceal orifice, and                            rectum were photographed. Scope In: 11:25:21 AM Scope Out: 11:39:26 AM Scope Withdrawal Time: 0 hours 9 minutes 19 seconds  Total Procedure Duration: 0 hours 14 minutes 5 seconds  Findings:                 The perianal and digital rectal examinations were                            normal.                           A 9 mm polyp was found in the cecum. The polyp was                            flat. The polyp was removed with a cold snare.                            Resection and retrieval were complete. Estimated                            blood loss was minimal.  The exam was otherwise without abnormality on                            direct and retroflexion views. Complications:            No immediate complications. Estimated blood loss:                            Minimal. Estimated Blood Loss:     Estimated blood loss was minimal. Impression:               - One 9-10 mm polyp in the cecum, removed with a                            cold snare. Resected and retrieved.                           - The examination was otherwise normal on direct                            and retroflexion views. Recommendation:           - Patient has a contact number available for                            emergencies. The signs and symptoms of potential                            delayed complications were discussed with the                            patient. Return to normal activities tomorrow.                            Written discharge instructions  were provided to the                            patient.                           - Resume previous diet.                           - Continue present medications.                           - Await pathology results.                           - Repeat colonoscopy date to be determined after                            pending pathology results are reviewed for                            surveillance.                           -  Emerging evidence supports eating a diet of                            fruits, vegetables, grains, calcium, and yogurt                            while reducing red meat and alcohol may reduce the                            risk of colon cancer.                           - Thank you for allowing me to be involved in your                            colon cancer prevention. Thornton Park MD, MD 07/12/2020 11:45:45 AM This report has been signed electronically.

## 2020-07-12 NOTE — Patient Instructions (Signed)
Handout on polyp given to you today  Await pathology results    YOU HAD AN ENDOSCOPIC PROCEDURE TODAY AT [facility]:   Refer to the procedure report that was given to you for any specific questions about what was found during the examination.  If the procedure report does not answer your questions, please call your gastroenterologist to clarify.  If you requested that your care partner not be given the details of your procedure findings, then the procedure report has been included in a sealed envelope for you to review at your convenience later.  YOU SHOULD EXPECT: Some feelings of bloating in the abdomen. Passage of more gas than usual.  Walking can help get rid of the air that was put into your GI tract during the procedure and reduce the bloating. If you had a lower endoscopy (such as a colonoscopy or flexible sigmoidoscopy) you may notice spotting of blood in your stool or on the toilet paper. If you underwent a bowel prep for your procedure, you may not have a normal bowel movement for a few days.  Please Note:  You might notice some irritation and congestion in your nose or some drainage.  This is from the oxygen used during your procedure.  There is no need for concern and it should clear up in a day or so.  SYMPTOMS TO REPORT IMMEDIATELY:   Following lower endoscopy (colonoscopy or flexible sigmoidoscopy):  Excessive amounts of blood in the stool  Significant tenderness or worsening of abdominal pains  Swelling of the abdomen that is new, acute  Fever of 100F or higher  For urgent or emergent issues, a gastroenterologist can be reached at any hour by calling [phone]. Do not use MyChart messaging for urgent concerns.    DIET:  We do recommend a small meal at first, but then you may proceed to your regular diet.  Drink plenty of fluids but you should avoid alcoholic beverages for 24 hours.  ACTIVITY:  You should plan to take it easy for the rest of today and  you should NOT DRIVE or use heavy machinery until tomorrow (because of the sedation medicines used during the test).    FOLLOW UP: Our staff will call the number listed on your records 48-72 hours following your procedure to check on you and address any questions or concerns that you may have regarding the information given to you following your procedure. If we do not reach you, we will leave a message.  We will attempt to reach you two times.  During this call, we will ask if you have developed any symptoms of COVID 19. If you develop any symptoms (ie: fever, flu-like symptoms, shortness of breath, cough etc.) before then, please call [phone].  If you test positive for Covid 19 in the 2 weeks post procedure, please call and report this information to [place].    If any biopsies were taken you will be contacted by phone or by letter within the next 1-3 weeks.  Please call us at [phone] if you have not heard about the biopsies in 3 weeks.    SIGNATURES/CONFIDENTIALITY: You and/or your care partner have signed paperwork which will be entered into your electronic medical record.  These signatures attest to the fact that that the information above on your After Visit Summary has been reviewed and is understood.  Full responsibility of the confidentiality of this discharge information lies with you and/or your care-partner.

## 2020-07-12 NOTE — Progress Notes (Signed)
Pt's states no medical or surgical changes since previsit or office visit.  Vitals- Courtney 

## 2020-07-13 LAB — HEPATITIS C ANTIBODY
Hepatitis C Ab: NONREACTIVE
SIGNAL TO CUT-OFF: 0.01 (ref <1.00)

## 2020-07-13 LAB — ANTI-SMOOTH MUSCLE ANTIBODY, IGG: Actin (Smooth Muscle) Antibody (IGG): <20 U (ref <20)

## 2020-07-13 LAB — ANTI-NUCLEAR AB-TITER (ANA TITER): ANA Titer 1: 1:80 {titer} — ABNORMAL HIGH

## 2020-07-13 LAB — ANA: Anti Nuclear Antibody (ANA): POSITIVE — AB

## 2020-07-13 LAB — IGG: IgG (Immunoglobin G), Serum: 1260 mg/dL (ref 600–1640)

## 2020-07-13 LAB — HEPATITIS B CORE ANTIBODY, TOTAL: Hep B Core Total Ab: NONREACTIVE

## 2020-07-13 LAB — HEPATITIS B SURFACE ANTIGEN: Hepatitis B Surface Ag: NONREACTIVE

## 2020-07-13 LAB — IGM: IgM, Serum: 82 mg/dL (ref 50–300)

## 2020-07-13 LAB — INSULIN, FREE (BIOACTIVE): Insulin, Free: 7.7 u[IU]/mL (ref 1.5–14.9)

## 2020-07-14 ENCOUNTER — Telehealth: Payer: Self-pay | Admitting: *Deleted

## 2020-07-14 ENCOUNTER — Telehealth: Payer: Self-pay

## 2020-07-14 NOTE — Telephone Encounter (Signed)
Left message on answering machine. 

## 2020-07-14 NOTE — Telephone Encounter (Signed)
  Follow up Call-  Call back number 07/12/2020  Post procedure Call Back phone  # [phone]  Permission to leave phone message Yes    LMOM to call back with any questions or concerns.  Also, call back if patient has developed fever, respiratory issues or been dx with COVID or had any family members or close contacts diagnosed since her procedure.

## 2020-08-10 ENCOUNTER — Encounter: Payer: Self-pay | Admitting: Gastroenterology

## 2020-09-29 ENCOUNTER — Encounter: Payer: Self-pay | Admitting: Internal Medicine

## 2020-11-13 DIAGNOSIS — Z1389 Encounter for screening for other disorder: Secondary | ICD-10-CM | POA: Diagnosis not present

## 2020-11-13 DIAGNOSIS — Z1231 Encounter for screening mammogram for malignant neoplasm of breast: Secondary | ICD-10-CM | POA: Diagnosis not present

## 2020-11-13 DIAGNOSIS — Z01419 Encounter for gynecological examination (general) (routine) without abnormal findings: Secondary | ICD-10-CM | POA: Diagnosis not present

## 2020-11-13 DIAGNOSIS — Z7189 Other specified counseling: Secondary | ICD-10-CM | POA: Diagnosis not present

## 2020-11-13 DIAGNOSIS — E559 Vitamin D deficiency, unspecified: Secondary | ICD-10-CM | POA: Diagnosis not present

## 2021-04-27 ENCOUNTER — Encounter: Payer: BC Managed Care – PPO | Admitting: Family Medicine

## 2021-05-09 ENCOUNTER — Encounter: Payer: BC Managed Care – PPO | Admitting: Family Medicine

## 2021-05-17 DIAGNOSIS — B351 Tinea unguium: Secondary | ICD-10-CM | POA: Diagnosis not present

## 2021-05-17 DIAGNOSIS — L602 Onychogryphosis: Secondary | ICD-10-CM | POA: Diagnosis not present

## 2021-05-17 DIAGNOSIS — L603 Nail dystrophy: Secondary | ICD-10-CM | POA: Diagnosis not present

## 2021-05-17 DIAGNOSIS — L608 Other nail disorders: Secondary | ICD-10-CM | POA: Diagnosis not present

## 2021-06-26 DIAGNOSIS — H9311 Tinnitus, right ear: Secondary | ICD-10-CM | POA: Diagnosis not present

## 2021-06-26 DIAGNOSIS — J309 Allergic rhinitis, unspecified: Secondary | ICD-10-CM | POA: Diagnosis not present

## 2021-06-26 DIAGNOSIS — H6121 Impacted cerumen, right ear: Secondary | ICD-10-CM | POA: Diagnosis not present

## 2021-08-08 DIAGNOSIS — E559 Vitamin D deficiency, unspecified: Secondary | ICD-10-CM | POA: Diagnosis not present

## 2021-08-14 DIAGNOSIS — H6981 Other specified disorders of Eustachian tube, right ear: Secondary | ICD-10-CM | POA: Diagnosis not present

## 2021-08-14 DIAGNOSIS — J309 Allergic rhinitis, unspecified: Secondary | ICD-10-CM | POA: Diagnosis not present

## 2021-10-06 IMAGING — US US ABDOMEN COMPLETE
1 series · 14 of 25 positions shown · non-contrast
Comparison: None.

CLINICAL DATA: Elevated LFTs.

EXAM:
ABDOMEN ULTRASOUND COMPLETE

[Series 1: us abdomen complete · 14 of 122 slices shown]
[im 1/122]
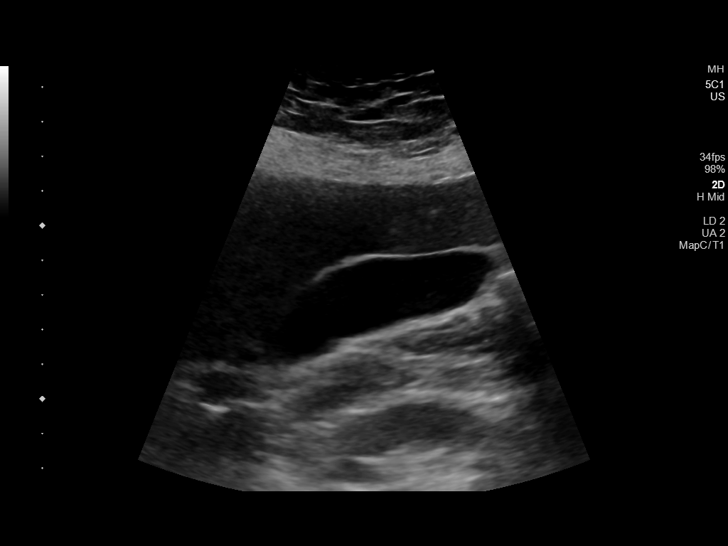
[im 11/122]
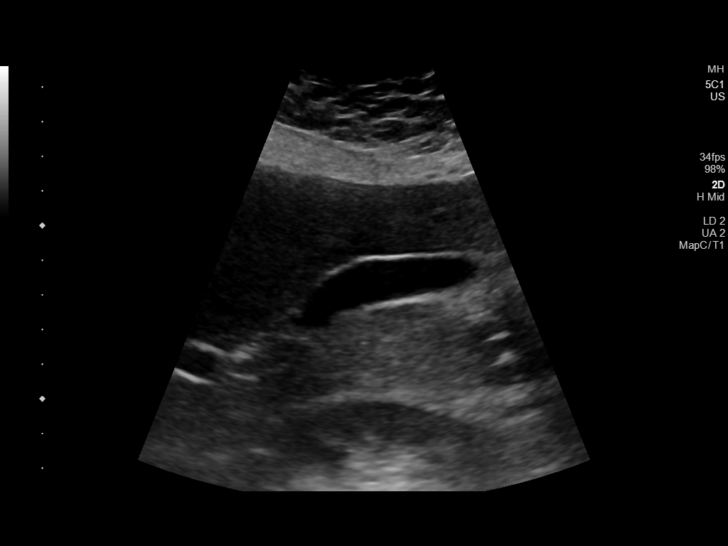
[im 21/122]
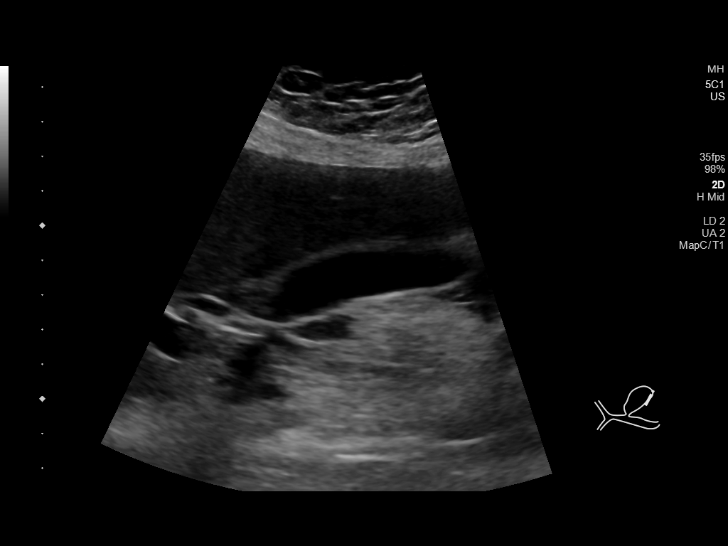
[im 31/122]
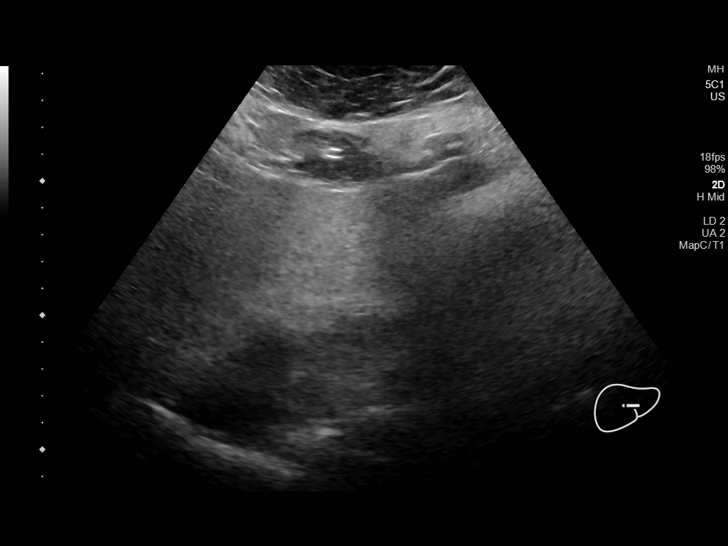
[im 41/122]
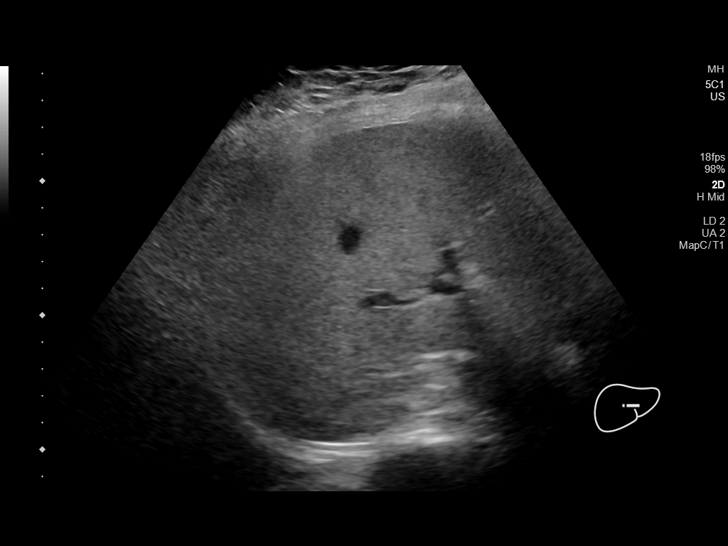
[im 46/122]
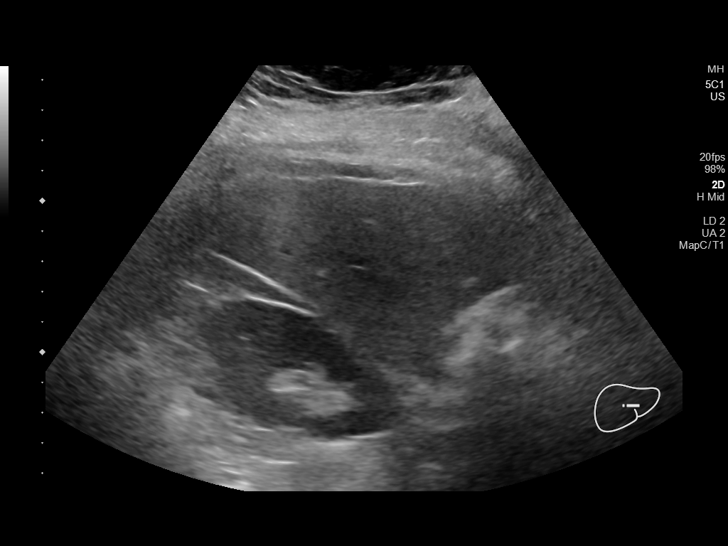
[im 56/122]
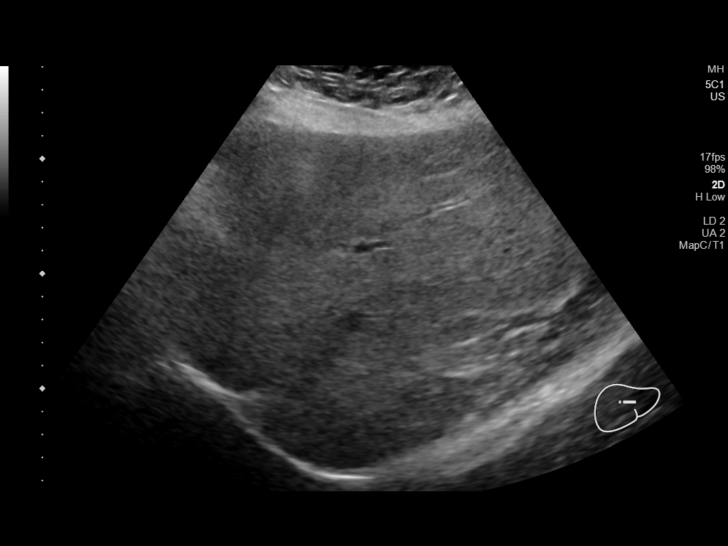
[im 66/122]
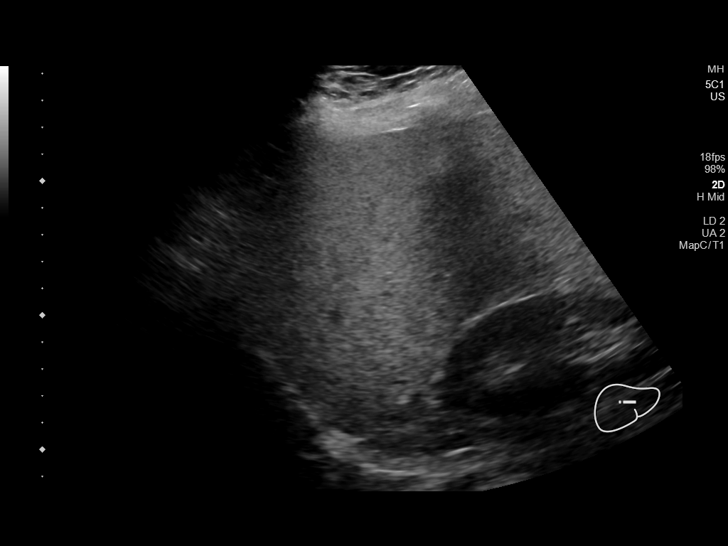
[im 76/122]
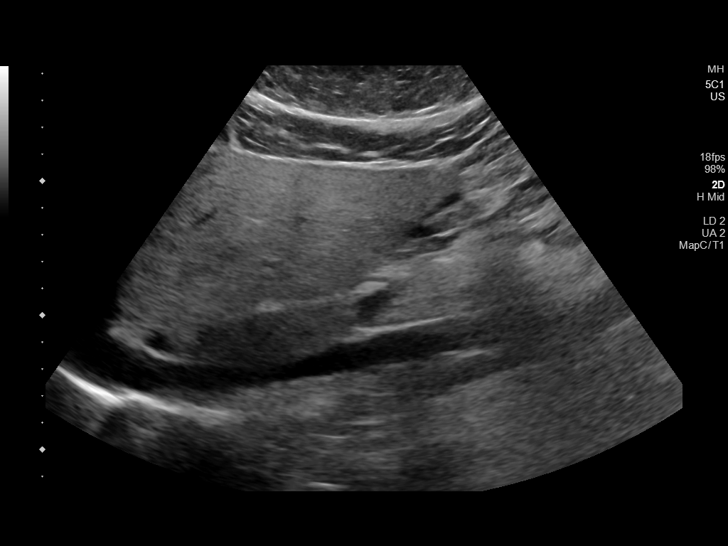
[im 81/122]
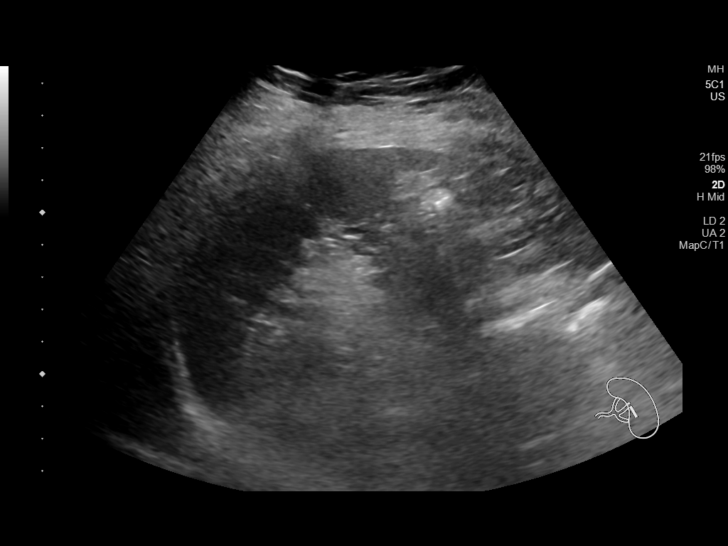
[im 91/122]
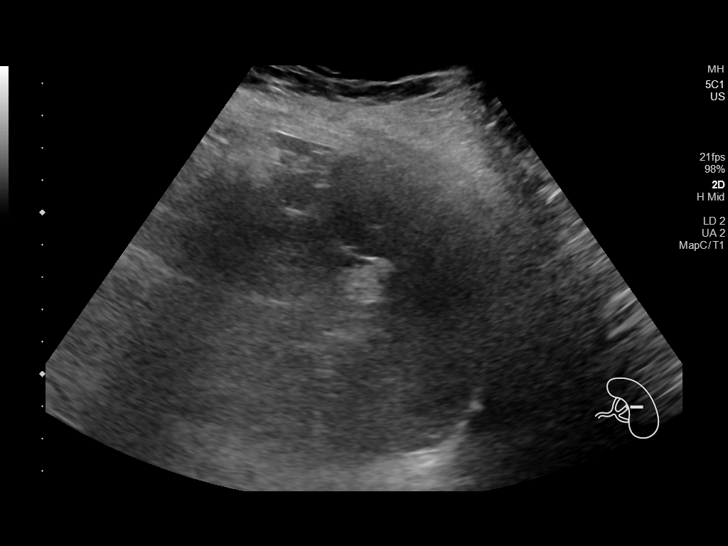
[im 101/122]
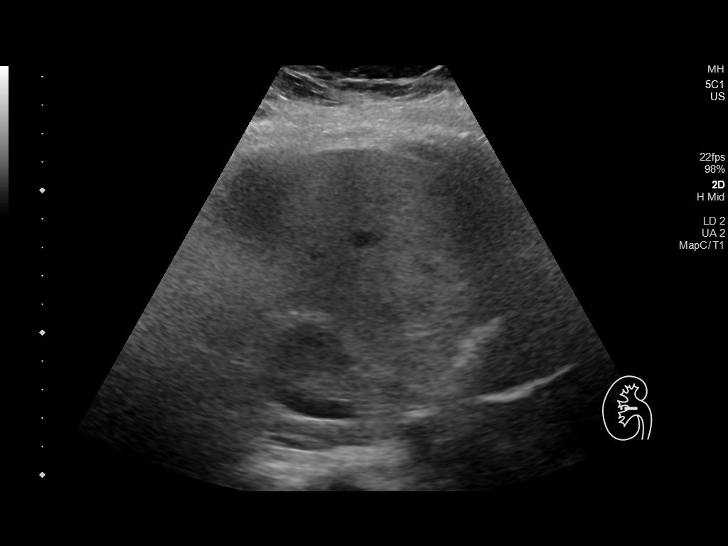
[im 111/122]
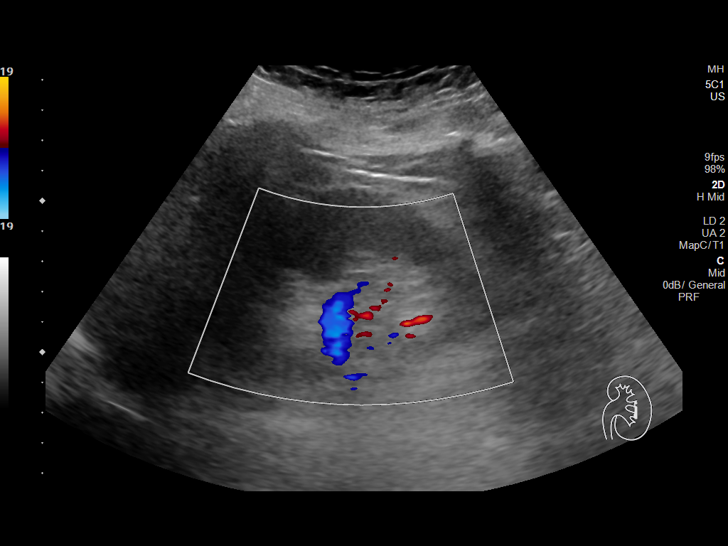
[im 122/122]
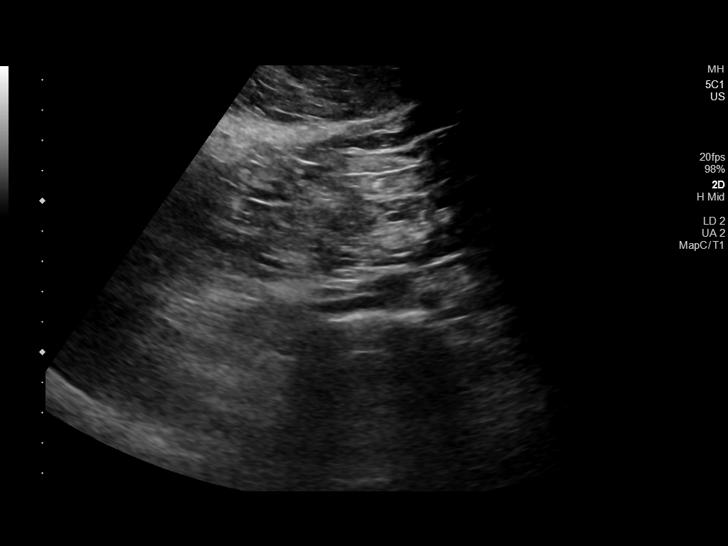

[14 of 25 positions shown; findings below may reference images not displayed]

FINDINGS: Gallbladder: No gallstones or wall thickening visualized. No
sonographic Murphy sign noted by sonographer.

Common bile duct: Diameter: 3.5 mm, within normal limits.

Liver: No focal lesion identified. Increased parenchymal
echogenicity. Portal vein is patent on color Doppler imaging with
normal direction of blood flow towards the liver.

IVC: No abnormality visualized.

Pancreas: Visualized portion unremarkable.

Spleen: Size and appearance within normal limits. Adjacent splenule.

Right Kidney: Length: 11.3 cm. Echogenicity within normal limits.
No mass or hydronephrosis visualized.

Left Kidney: Length: 10.5 cm. Echogenicity within normal limits. No
mass or hydronephrosis visualized.

Abdominal aorta: No aneurysm visualized.

Other findings: None.
IMPRESSION: Hepatic steatosis.  No focal hepatic lesion.

Otherwise unremarkable abdominal ultrasound.

## 2021-11-15 DIAGNOSIS — Z1231 Encounter for screening mammogram for malignant neoplasm of breast: Secondary | ICD-10-CM | POA: Diagnosis not present

## 2022-01-02 DIAGNOSIS — Z01419 Encounter for gynecological examination (general) (routine) without abnormal findings: Secondary | ICD-10-CM | POA: Diagnosis not present

## 2022-01-02 DIAGNOSIS — E559 Vitamin D deficiency, unspecified: Secondary | ICD-10-CM | POA: Diagnosis not present

## 2022-01-02 DIAGNOSIS — Z1389 Encounter for screening for other disorder: Secondary | ICD-10-CM | POA: Diagnosis not present

## 2022-07-09 DIAGNOSIS — B351 Tinea unguium: Secondary | ICD-10-CM | POA: Diagnosis not present

## 2022-07-17 ENCOUNTER — Ambulatory Visit
Admission: EM | Admit: 2022-07-17 | Discharge: 2022-07-17 | Disposition: A | Discharge status: Home / Self Care | Payer: BC Managed Care – PPO | Attending: Physician Assistant | Admitting: Physician Assistant

## 2022-07-17 DIAGNOSIS — H65191 Other acute nonsuppurative otitis media, right ear: Secondary | ICD-10-CM

## 2022-07-17 DIAGNOSIS — J0100 Acute maxillary sinusitis, unspecified: Secondary | ICD-10-CM | POA: Diagnosis not present

## 2022-07-17 MED ORDER — AMOXICILLIN-POT CLAVULANATE 875-125 MG PO TABS: 1.0000 | ORAL_TABLET | Freq: Two times a day (BID) | ORAL | 0 refills | Status: DC

## 2022-07-17 NOTE — ED Provider Notes (Signed)
[facility]    CSN: [id number] Arrival date & time: 07/17/22  1513      History   Chief Complaint Chief Complaint  Patient presents with   Otalgia    HPI Heather Whitney is a 56 y.o. female.   Patient here today for evaluation of sinus pressure, congestion and ear pain that started several weeks ago but that has seemed to worsen recently. She reports more discomfort in her right ear than left. She has not had fever. She denies sore throat or fever. She has not had cough. She has tried OTC medication without significant relief.   The history is provided by the patient.    Past Medical History:  Diagnosis Date   Elevated alkaline phosphatase level 03/14/2020   Elevated ALT measurement 03/14/2020    Patient Active Problem List   Diagnosis Date Noted   Vitamin D deficiency 04/26/2020   Obesity (BMI 30-39.9) 04/26/2020   Elevated ALT measurement 03/14/2020   Elevated alkaline phosphatase level 03/14/2020   Complaint of rash 03/13/2020   Paresthesias 03/13/2020    Past Surgical History:  Procedure Laterality Date   WISDOM TOOTH EXTRACTION      OB History   No obstetric history on file.      Home Medications    Prior to Admission medications   Medication Sig Start Date End Date Taking? Authorizing Provider  amoxicillin-clavulanate (AUGMENTIN) 875-125 MG tablet Take 1 tablet by mouth every 12 (twelve) hours. 07/17/22  Yes Francene Finders, PA-C    Family History Family History  Problem Relation Age of Onset   Hypertension Paternal Grandmother     Social History Social History   Tobacco Use   Smoking status: Never   Smokeless tobacco: Never  Substance Use Topics   Alcohol use: Yes    Comment: occasional    Drug use: Never     Allergies   Patient has no known allergies.   Review of Systems Review of Systems  Constitutional:  Negative for chills and fever.  HENT:  Positive for congestion, ear pain and sinus pressure. Negative for sore  throat.   Eyes:  Negative for discharge and redness.  Respiratory:  Negative for cough, shortness of breath and wheezing.   Gastrointestinal:  Negative for abdominal pain, diarrhea, nausea and vomiting.     Physical Exam Triage Vital Signs ED Triage Vitals  Enc Vitals Group     BP      Pulse      Resp      Temp      Temp src      SpO2      Weight      Height      Head Circumference      Peak Flow      Pain Score      Pain Loc      Pain Edu?      Excl. in [place]?    No data found.  Updated Vital Signs BP 136/84 (BP Location: Left Arm)   Pulse 65   Temp 97.9 F (36.6 C) (Oral)   Resp 18   LMP 11/16/2019   SpO2 96%      Physical Exam Vitals and nursing note reviewed.  Constitutional:      General: She is not in acute distress.    Appearance: Normal appearance. She is not ill-appearing.  HENT:     Head: Normocephalic and atraumatic.     Ears:     Comments: Bilateral Tms  dull, right TM bulging     Nose: Congestion present.     Mouth/Throat:     Mouth: Mucous membranes are moist.  Eyes:     Conjunctiva/sclera: Conjunctivae normal.  Cardiovascular:     Rate and Rhythm: Normal rate.  Pulmonary:     Effort: Pulmonary effort is normal. No respiratory distress.  Skin:    General: Skin is warm and dry.  Neurological:     Mental Status: She is alert.  Psychiatric:        Mood and Affect: Mood normal.        Thought Content: Thought content normal.      UC Treatments / Results  Labs (all labs ordered are listed, but only abnormal results are displayed) Labs Reviewed - No data to display  EKG   Radiology No results found.  Procedures Procedures (including critical care time)  Medications Ordered in UC Medications - No data to display  Initial Impression / Assessment and Plan / UC Course  I have reviewed the triage vital signs and the nursing notes.  Pertinent labs & imaging results that were available during my care of the patient were reviewed by  me and considered in my medical decision making (see chart for details).    Augmentin prescribed to cover both sinusitis and otitis media. Encouraged she use flonase that she reports she has at home and recommend follow up if no improvement with treatment or with any worsening.   Final Clinical Impressions(s) / UC Diagnoses   Final diagnoses:  Other acute nonsuppurative otitis media of right ear, recurrence not specified  Acute maxillary sinusitis, recurrence not specified   Discharge Instructions   None    ED Prescriptions     Medication Sig Dispense Auth. Provider   amoxicillin-clavulanate (AUGMENTIN) 875-125 MG tablet Take 1 tablet by mouth every 12 (twelve) hours. 14 tablet Francene Finders, PA-C      PDMP not reviewed this encounter.   Francene Finders, PA-C 07/17/22 1606

## 2022-07-17 NOTE — ED Triage Notes (Signed)
Pt c/o bilateral ear pain (aching), ear fullness, and sinus issues (congestion).  Home interventions: H[place]ands eardrops  Started: a few weeks ago

## 2022-10-07 ENCOUNTER — Ambulatory Visit
Admission: EM | Admit: 2022-10-07 | Discharge: 2022-10-07 | Disposition: A | Discharge status: Home / Self Care | Payer: BC Managed Care – PPO | Attending: Urgent Care | Admitting: Urgent Care

## 2022-10-07 DIAGNOSIS — H65192 Other acute nonsuppurative otitis media, left ear: Secondary | ICD-10-CM

## 2022-10-07 DIAGNOSIS — H6993 Unspecified Eustachian tube disorder, bilateral: Secondary | ICD-10-CM

## 2022-10-07 DIAGNOSIS — J018 Other acute sinusitis: Secondary | ICD-10-CM

## 2022-10-07 MED ORDER — CETIRIZINE HCL 10 MG PO TABS: 10.0000 mg | ORAL_TABLET | Freq: Every day | ORAL | 0 refills | Status: DC

## 2022-10-07 MED ORDER — CEFDINIR 300 MG PO CAPS: 300.0000 mg | ORAL_CAPSULE | Freq: Two times a day (BID) | ORAL | 0 refills | Status: DC

## 2022-10-07 MED ORDER — PSEUDOEPHEDRINE HCL 60 MG PO TABS: 60.0000 mg | ORAL_TABLET | Freq: Three times a day (TID) | ORAL | 0 refills | Status: DC | PRN

## 2022-10-07 MED ORDER — FLUTICASONE PROPIONATE 50 MCG/ACT NA SUSP: 2.0000 | Freq: Every day | NASAL | 12 refills | Status: DC

## 2022-10-07 NOTE — Discharge Instructions (Addendum)
Start taking Zyrtec daily through November. Use pseudoephedrine this week and then only as needed. Start 10 days of cefdinir to clear your secondary ear infection. This is secondary to eustachian tube dysfunction from allergies, changes in weather, flying. Every year September through November start using Zyrtec and Flonase daily. Use pseudoephedrine as needed. For now, Wait until you finish all the antibiotic to start using Flonase.

## 2022-10-07 NOTE — ED Provider Notes (Signed)
[facility]  Note:  This document was prepared using Systems analyst and may include unintentional dictation errors.  MRN: [id number] DOB: 12/31/1965  Subjective:   Heather Whitney is a 56 y.o. female presenting for 2-week history of persistent bilateral ear pain.  Initially symptoms were on the right but this past week has been the left side.  Initially she had a lot of sinus symptoms, congestion, drainage, sneezing and coughing.  She suspects that this was related to difficulty from her allergies and also flying which ended up eliciting some of the ear pain.  Has a history of allergic rhinitis for years uses Claritin as needed.  Patient last had an antibiotic course 07/17/2022, completed course of Augmentin.  No cough, chest pain, shortness of breath, ear drainage, tinnitus, dizziness.  No current facility-administered medications for this encounter.  Current Outpatient Medications:    amoxicillin-clavulanate (AUGMENTIN) 875-125 MG tablet, Take 1 tablet by mouth every 12 (twelve) hours., Disp: 14 tablet, Rfl: 0   No Known Allergies  Past Medical History:  Diagnosis Date   Elevated alkaline phosphatase level 03/14/2020   Elevated ALT measurement 03/14/2020     Past Surgical History:  Procedure Laterality Date   WISDOM TOOTH EXTRACTION      Family History  Problem Relation Age of Onset   Hypertension Paternal Grandmother     Social History   Tobacco Use   Smoking status: Never   Smokeless tobacco: Never  Substance Use Topics   Alcohol use: Yes    Comment: occasional    Drug use: Never    ROS   Objective:   Vitals: BP 130/87 (BP Location: Right Arm)   Pulse 84   Temp 97.9 F (36.6 C) (Oral)   Resp 16   LMP 11/16/2019   SpO2 94%   Physical Exam Constitutional:      General: She is not in acute distress.    Appearance: Normal appearance. She is well-developed and normal weight. She is not ill-appearing, toxic-appearing  or diaphoretic.  HENT:     Head: Normocephalic and atraumatic.     Right Ear: Tympanic membrane, ear canal and external ear normal. No drainage or tenderness. No middle ear effusion. There is no impacted cerumen. Tympanic membrane is not erythematous or bulging.     Left Ear: Ear canal and external ear normal. No drainage or tenderness. A middle ear effusion is present. There is no impacted cerumen. Tympanic membrane is not erythematous or bulging.     Nose: Nose normal. No congestion or rhinorrhea.     Mouth/Throat:     Mouth: Mucous membranes are moist. No oral lesions.     Pharynx: No pharyngeal swelling, oropharyngeal exudate, posterior oropharyngeal erythema or uvula swelling.     Tonsils: No tonsillar exudate or tonsillar abscesses.  Eyes:     General: No scleral icterus.       Right eye: No discharge.        Left eye: No discharge.     Extraocular Movements: Extraocular movements intact.     Right eye: Normal extraocular motion.     Left eye: Normal extraocular motion.     Conjunctiva/sclera: Conjunctivae normal.  Cardiovascular:     Rate and Rhythm: Normal rate.  Pulmonary:     Effort: Pulmonary effort is normal.  Musculoskeletal:     Cervical back: Normal range of motion and neck supple.  Lymphadenopathy:     Cervical: No cervical adenopathy.  Skin:  General: Skin is warm and dry.  Neurological:     General: No focal deficit present.     Mental Status: She is alert and oriented to person, place, and time.  Psychiatric:        Mood and Affect: Mood normal.        Behavior: Behavior normal.     Assessment and Plan :   PDMP not reviewed this encounter.  1. Other non-recurrent acute nonsuppurative otitis media of left ear   2. Acute non-recurrent sinusitis of other sinus   3. Eustachian tube dysfunction, bilateral     Given physical exam findings will cover for otitis media of the left ear with cefdinir.  Would like to avoid use of Augmentin given that she just  had a course of this.  Recommend starting a regimen for maintenance of her allergic rhinitis and eustachian tube dysfunction. Counseled patient on potential for adverse effects with medications prescribed/recommended today, ER and return-to-clinic precautions discussed, patient verbalized understanding.    Jaynee Eagles, PA-C 10/07/22 1539

## 2022-10-07 NOTE — ED Triage Notes (Signed)
Patient presents to [facility] for bilateral ear pain since 10/07. States she is fighting a sinus infection. Treating with Claritin and Advil.   Denies fever or ear drainage.

## 2022-10-08 ENCOUNTER — Telehealth: Payer: Self-pay

## 2022-10-08 NOTE — Telephone Encounter (Signed)
I confirmed this with RT Dunning.

## 2022-10-08 NOTE — Telephone Encounter (Signed)
Heather Whitney from Pharmacy called stating they don't have the '60mg'$  but they have '30mg'$  in qty of 48. Per Heather Whitney [facility], approve for '30mg'$ , qty 48 same SIG.

## 2022-12-03 DIAGNOSIS — B351 Tinea unguium: Secondary | ICD-10-CM | POA: Diagnosis not present

## 2022-12-24 ENCOUNTER — Encounter: Payer: Self-pay | Admitting: Gastroenterology

## 2022-12-24 ENCOUNTER — Ambulatory Visit (INDEPENDENT_AMBULATORY_CARE_PROVIDER_SITE_OTHER): Payer: BC Managed Care – PPO | Admitting: Gastroenterology

## 2022-12-24 ENCOUNTER — Other Ambulatory Visit: Payer: BC Managed Care – PPO

## 2022-12-24 VITALS — BP 126/72 | HR 82 | Ht 64.0 in | Wt 216.2 lb

## 2022-12-24 DIAGNOSIS — R748 Abnormal levels of other serum enzymes: Secondary | ICD-10-CM

## 2022-12-24 DIAGNOSIS — K625 Hemorrhage of anus and rectum: Secondary | ICD-10-CM

## 2022-12-24 DIAGNOSIS — R932 Abnormal findings on diagnostic imaging of liver and biliary tract: Secondary | ICD-10-CM

## 2022-12-24 MED ORDER — NA SULFATE-K SULFATE-MG SULF 17.5-3.13-1.6 GM/177ML PO SOLN: 1.0000 | Freq: Once | ORAL | 0 refills | Status: AC

## 2022-12-24 NOTE — Addendum Note (Signed)
Addended by: Horris Latino on: 12/24/2022 03:41 PM   Modules accepted: Orders

## 2022-12-24 NOTE — Patient Instructions (Addendum)
Your provider has requested that you go to the basement level for lab work before leaving today. Press "B" on the elevator. The lab is located at the first door on the left as you exit the elevator.  Your abnormal liver enzymes are likely due to fatty liver. I have recommended some additional labs and another ultrasound. You have been scheduled for an abdominal ultrasound at [facility] (1st floor of hospital) on Monday 12/30/22 at 10 am. Please arrive 30 minutes prior to your appointment for registration. Make certain not to have anything to eat or drink 6 hours prior to your appointment. Should you need to reschedule your appointment, please contact radiology at [phone]. This test typically takes about 30 minutes to perform.;r  Unfortunately, there are no FDA-approved medications for fatty liver disease.   The good news is that the most effective treatment so far for fatty liver disease does not involve medications, but rather lifestyle changes. The bad news is that these are typically hard to achieve and maintain for many people. Here's what we know helps:  - Lose weight. Weight loss of roughly 5% of your body weight might be enough to improve abnormal liver tests and decrease the fat in the liver. Losing between 7% and 10% of body weight seems to decrease the amount of inflammation and injury to liver cells, and it may even reverse some of the damage of fibrosis. Target a gradual weight loss of 1 to 2 pounds per week, as very rapid weight loss may worsen inflammation and fibrosis. You may want to explore the option of weight loss surgery with your doctor, if you aren't making any headway with weight loss and your health is suffering. - It appears that aerobic exercise also leads to decreased fat in the liver, and with vigorous intensity, possibly also decreased inflammation independent of weight loss. - Eat well. Some studies suggest that the Mediterranean diet may also decrease the fat  in the liver. This nutrition plan emphasizes fruits, vegetables, whole grains, legumes, nuts, replacing butter with olive or canola oil, limiting red meat, and eating more fish and lean poultry. - Drink coffee, maybe? Some studies showed that patients with NAFLD who drank coffee (about two cups every day) had a decreased risk in fibrosis. However, take into consideration the downsides of regular caffeine intake.  Even though it can be difficult to make these lifestyle changes and lose the weight, the benefit is immense if you have fatty liver, so give it your best effort! The greatest risk for people with a fatty liver is cardiovascular disease. Not only can some of these lifestyle changes improve or resolve your fatty liver, they will also help keep your heart healthy.   We also discussed proceeding with a colonoscopy and upper endoscopy to evaluate your recent symptoms.   Please let me know if you have any questions before your procedures.

## 2022-12-24 NOTE — Progress Notes (Signed)
Referring Provider: No ref. provider found Primary Care Physician:  Patient, No Pcp Per  Reason for Consultation:  Elevated LFTs   IMPRESSION:  Abnormal liver enzymes - elevated alk phos and ALT    - normal alk phos isoenzymes    - fatty liver on ultrasound    - negative serologies for AIH, HCV, HBV, hemochromatosis Intermittent painless rectal bleeding    - small internal hemorrhoids seen on colonoscopy in 2021    - must exclude other etiologies given new symptoms onset and newly IDd family history Lifetime history of bloating History of colon polyp    - tubular adenoma on colonoscopy 2021, surveillance recommended 2028 Family history of colon polyps (mother, father, and brother)  Abnormal liver enzymes: Suspected fatty liver. Labs today to evaluate for additional causes of abnormal liver enzymes. Elastography for staging.  Changed surveillance interval to 2026 given the newly identified family history. In looking at the pathology specimen from her polyp, it may have been larger than 43m, as well.     PLAN: - CMP, CBC - Celiac panel, TSH - FibroSURE - Elastography - EGD  - Colonoscopy - Consider cross-sectional if endoscopic evaluation is non-diagnostic - Lifestyle modifications for fatty liver reviewed (see patient instructions) - [facility] to [facility]  Please see the "Patient Instructions" section for addition details about the plan.  HPI: Heather Whitney a 57y.o. female presents today for follow-up.  She was last seen in 06/2020 for colon cancer screening and abnormal liver enzymes. There was no identified risk for chronic viral hepatitis. No family history of liver disease.  Available labs: 03/13/20: AST 33, ALT 46, alk phos 165, TB 0.5 03/16/20: AST 37, ALT 63, alk phos 161, TB 0.4 03/16/20: alk phos isoenzymes normal with bone 32%, intestinal 0%, liver 68% 04/26/20: AST 26, ALT 33, alk phos 180, TB 0.3 07/05/20: ANA + 1:80 in nuclear,  homogeneous pattern, ASMA negative, IgM and IgG normal, testing for HBV and HCV negative, ferritin and iron normal, insulin 7.7, glucose 100 HOMA IR 1.9  No further labs have been obtained since 2021.  Abdominal ultrasound 07/10/20: hepatic steatosis, otherwise normal  She has recently been experiencing intermittent painless rectal bleeding. No identified exacerbating or relieving features. This is a new development since her last colonoscopy.   She continues to have some bloating. She has identified triggers of citrus, spicy foods, tomatoes, oily, and raw vegetables.   Endoscopic history: Colonoscopy 07/12/20: 984mcecal tubular adenoma. Surveillance recommended in 7 years.   Today she reports that her mother, father, and brother had had colon polyps. Paternal great aunt/uncles died from colon cancer.   Past Medical History:  Diagnosis Date   Colon polyps    Elevated alkaline phosphatase level 03/14/2020   Elevated ALT measurement 03/14/2020    Past Surgical History:  Procedure Laterality Date   WISDOM TOOTH EXTRACTION      Current Outpatient Medications  Medication Sig Dispense Refill   cefdinir (OMNICEF) 300 MG capsule Take 1 capsule (300 mg total) by mouth 2 (two) times daily. 20 capsule 0   cetirizine (ZYRTEC ALLERGY) 10 MG tablet Take 1 tablet (10 mg total) by mouth daily. 30 tablet 0   fluticasone (FLONASE) 50 MCG/ACT nasal spray Place 2 sprays into both nostrils daily. 16 g 12   pseudoephedrine (SUDAFED) 60 MG tablet Take 1 tablet (60 mg total) by mouth every 8 (eight) hours as needed for congestion. 30 tablet 0   No current  facility-administered medications for this visit.    Allergies as of 12/24/2022   (No Known Allergies)    Family History  Problem Relation Age of Onset   Colon polyps Mother    Colon polyps Father    Colon polyps Brother    Hypertension Paternal Grandmother    Colon cancer Neg Hx    Esophageal cancer Neg Hx    Stomach cancer Neg Hx      Physical Exam: 202 pounds 07/05/20 216 pounds today General:   Alert,  well-nourished, pleasant and cooperative in NAD Head:  Normocephalic and atraumatic. Eyes:  Sclera clear, no icterus.   Conjunctiva pink. Ears:  Normal auditory acuity. Nose:  No deformity, discharge,  or lesions. Mouth:  No deformity or lesions.   Neck:  Supple; no masses or thyromegaly. Lungs:  Clear throughout to auscultation.   No wheezes. Heart:  Regular rate and rhythm; no murmurs. Abdomen:  Soft,nontender, nondistended, normal bowel sounds, no rebound or guarding. No hepatosplenomegaly.   Rectal:  Deferred  Msk:  Symmetrical. No boney deformities LAD: No inguinal or umbilical LAD Extremities:  No clubbing or edema. Neurologic:  Alert and  oriented x4;  grossly nonfocal Skin:  Intact without significant lesions or rashes. Psych:  Alert and cooperative. Normal mood and affect.   Heather Edgell L. Tarri Glenn, MD, MPH 12/24/2022, 2:41 PM

## 2022-12-30 ENCOUNTER — Other Ambulatory Visit: Payer: Self-pay | Admitting: *Deleted

## 2022-12-30 ENCOUNTER — Ambulatory Visit (HOSPITAL_COMMUNITY)
Admission: RE | Admit: 2022-12-30 | Discharge: 2022-12-30 | Disposition: A | Discharge status: Home / Self Care | Payer: BC Managed Care – PPO | Source: Ambulatory Visit | Attending: Gastroenterology | Admitting: Gastroenterology

## 2022-12-30 ENCOUNTER — Other Ambulatory Visit (INDEPENDENT_AMBULATORY_CARE_PROVIDER_SITE_OTHER): Payer: BC Managed Care – PPO

## 2022-12-30 DIAGNOSIS — R945 Abnormal results of liver function studies: Secondary | ICD-10-CM | POA: Diagnosis not present

## 2022-12-30 DIAGNOSIS — R932 Abnormal findings on diagnostic imaging of liver and biliary tract: Secondary | ICD-10-CM | POA: Diagnosis not present

## 2022-12-30 DIAGNOSIS — K625 Hemorrhage of anus and rectum: Secondary | ICD-10-CM

## 2022-12-30 DIAGNOSIS — R748 Abnormal levels of other serum enzymes: Secondary | ICD-10-CM | POA: Insufficient documentation

## 2022-12-30 LAB — COMPREHENSIVE METABOLIC PANEL
ALT: 12 U/L (ref 0–35)
AST: 13 U/L (ref 0–37)
Albumin: 4.2 g/dL (ref 3.5–5.2)
Alkaline Phosphatase: 130 U/L — ABNORMAL HIGH (ref 39–117)
BUN: 15 mg/dL (ref 6–23)
CO2: 26 mEq/L (ref 19–32)
Calcium: 9.2 mg/dL (ref 8.4–10.5)
Chloride: 103 mEq/L (ref 96–112)
Creatinine, Ser: 0.84 mg/dL (ref 0.40–1.20)
GFR: 78.65 mL/min (ref >60.00)
Glucose, Bld: 102 mg/dL — ABNORMAL HIGH (ref 70–99)
Potassium: 3.8 mEq/L (ref 3.5–5.1)
Sodium: 139 mEq/L (ref 135–145)
Total Bilirubin: 0.4 mg/dL (ref 0.2–1.2)
Total Protein: 7.5 g/dL (ref 6.0–8.3)

## 2022-12-30 LAB — CBC WITH DIFFERENTIAL/PLATELET
Basophils Absolute: 0.1 10*3/uL (ref 0.0–0.1)
Basophils Relative: 1.4 % (ref 0.0–3.0)
Eosinophils Absolute: 0.2 10*3/uL (ref 0.0–0.7)
Eosinophils Relative: 1.7 % (ref 0.0–5.0)
HCT: 39.2 % (ref 36.0–46.0)
Hemoglobin: 13.4 g/dL (ref 12.0–15.0)
Lymphocytes Relative: 27.6 % (ref 12.0–46.0)
Lymphs Abs: 2.6 10*3/uL (ref 0.7–4.0)
MCHC: 34.2 g/dL (ref 30.0–36.0)
MCV: 86.5 fl (ref 78.0–100.0)
Monocytes Absolute: 0.6 10*3/uL (ref 0.1–1.0)
Monocytes Relative: 6.1 % (ref 3.0–12.0)
Neutro Abs: 5.8 10*3/uL (ref 1.4–7.7)
Neutrophils Relative %: 63.2 % (ref 43.0–77.0)
Platelets: 365 10*3/uL (ref 150.0–400.0)
RBC: 4.53 Mil/uL (ref 3.87–5.11)
RDW: 13.2 % (ref 11.5–15.5)
WBC: 9.3 10*3/uL (ref 4.0–10.5)

## 2022-12-30 LAB — TSH: TSH: 4.09 u[IU]/mL (ref 0.35–5.50)

## 2022-12-31 LAB — TISSUE TRANSGLUTAMINASE ABS,IGG,IGA
(tTG) Ab, IgA: <1 U/mL
(tTG) Ab, IgG: <1 U/mL

## 2022-12-31 LAB — IGA: Immunoglobulin A: 600 mg/dL — ABNORMAL HIGH (ref 47–310)

## 2023-01-01 LAB — NASH FIBROSURE(R) PLUS
ALPHA 2-MACROGLOBULINS, QN: 233 mg/dL (ref 110–276)
ALT (SGPT) P5P: 14 IU/L (ref 0–40)
AST (SGOT) P5P: 18 IU/L (ref 0–40)
Apolipoprotein A-1: 149 mg/dL (ref 116–209)
Bilirubin, Total: 0.2 mg/dL (ref 0.0–1.2)
Cholesterol, Total: 232 mg/dL — ABNORMAL HIGH (ref 100–199)
Fibrosis Score: 0.11 (ref 0.00–0.21)
GGT: 23 IU/L (ref 0–60)
Glucose: 102 mg/dL — ABNORMAL HIGH (ref 70–99)
Haptoglobin: 165 mg/dL (ref 33–346)
NASH Score: 0 (ref 0.00–0.25)
Steatosis Score: 0.36 (ref 0.00–0.40)
Triglycerides: 105 mg/dL (ref 0–149)

## 2023-01-07 DIAGNOSIS — R92323 Mammographic fibroglandular density, bilateral breasts: Secondary | ICD-10-CM | POA: Diagnosis not present

## 2023-01-07 DIAGNOSIS — M85852 Other specified disorders of bone density and structure, left thigh: Secondary | ICD-10-CM | POA: Diagnosis not present

## 2023-01-07 DIAGNOSIS — Z1231 Encounter for screening mammogram for malignant neoplasm of breast: Secondary | ICD-10-CM | POA: Diagnosis not present

## 2023-01-07 DIAGNOSIS — Z01419 Encounter for gynecological examination (general) (routine) without abnormal findings: Secondary | ICD-10-CM | POA: Diagnosis not present

## 2023-01-07 DIAGNOSIS — Z780 Asymptomatic menopausal state: Secondary | ICD-10-CM | POA: Diagnosis not present

## 2023-01-07 DIAGNOSIS — Z1239 Encounter for other screening for malignant neoplasm of breast: Secondary | ICD-10-CM | POA: Diagnosis not present

## 2023-01-07 DIAGNOSIS — M8588 Other specified disorders of bone density and structure, other site: Secondary | ICD-10-CM | POA: Diagnosis not present

## 2023-01-07 DIAGNOSIS — E559 Vitamin D deficiency, unspecified: Secondary | ICD-10-CM | POA: Diagnosis not present

## 2023-01-17 ENCOUNTER — Encounter: Payer: Self-pay | Admitting: Gastroenterology

## 2023-01-24 ENCOUNTER — Ambulatory Visit (AMBULATORY_SURGERY_CENTER): Payer: BC Managed Care – PPO | Admitting: Gastroenterology

## 2023-01-24 ENCOUNTER — Encounter: Payer: Self-pay | Admitting: Gastroenterology

## 2023-01-24 VITALS — BP 145/79 | HR 73 | Temp 97.5°F | Resp 16 | Ht 64.0 in | Wt 216.0 lb

## 2023-01-24 DIAGNOSIS — K319 Disease of stomach and duodenum, unspecified: Secondary | ICD-10-CM | POA: Diagnosis not present

## 2023-01-24 DIAGNOSIS — D122 Benign neoplasm of ascending colon: Secondary | ICD-10-CM | POA: Diagnosis not present

## 2023-01-24 DIAGNOSIS — K209 Esophagitis, unspecified without bleeding: Secondary | ICD-10-CM | POA: Diagnosis not present

## 2023-01-24 DIAGNOSIS — R140 Abdominal distension (gaseous): Secondary | ICD-10-CM

## 2023-01-24 DIAGNOSIS — K635 Polyp of colon: Secondary | ICD-10-CM | POA: Diagnosis not present

## 2023-01-24 DIAGNOSIS — K625 Hemorrhage of anus and rectum: Secondary | ICD-10-CM

## 2023-01-24 DIAGNOSIS — K317 Polyp of stomach and duodenum: Secondary | ICD-10-CM | POA: Diagnosis not present

## 2023-01-24 MED ORDER — PANTOPRAZOLE SODIUM 40 MG PO TBEC: 40.0000 mg | DELAYED_RELEASE_TABLET | Freq: Two times a day (BID) | ORAL | 2 refills | Status: DC

## 2023-01-24 MED ORDER — HYDROCORTISONE ACETATE 25 MG RE SUPP: 25.0000 mg | Freq: Two times a day (BID) | RECTAL | 0 refills | Status: DC | PRN

## 2023-01-24 MED ORDER — SODIUM CHLORIDE 0.9 % IV SOLN: 500.0000 mL | Freq: Once | INTRAVENOUS | Status: DC

## 2023-01-24 NOTE — Progress Notes (Unsigned)
   Referring Provider: Thornton Park, MD Primary Care Physician:  Patient, No Pcp Per   Indication for EGD:  Post-prandial bloating Indication for Colonoscopy:  Painless rectal bleeding   IMPRESSION:  Post-prandial bloating Painless rectal bleeding Appropriate candidate for monitored anesthesia care  PLAN: EGD and Colonoscopy in the [place] today   HPI: Heather Whitney is a 57 y.o. female presents for EGD and colonoscopy.  She has recently been experiencing intermittent painless rectal bleeding. No identified exacerbating or relieving features. This is a new development since her last colonoscopy.    She reports bloating. She has identified triggers of citrus, spicy foods, tomatoes, oily, and raw vegetables.    Endoscopic history: Colonoscopy 07/12/20: 23m cecal tubular adenoma. Surveillance recommended in 7 years.    Today she reports that her mother, father, and brother had had colon polyps. Paternal great aunt/uncles died from colon cancer.   Past Medical History:  Diagnosis Date   Colon polyps    Elevated alkaline phosphatase level 03/14/2020   Elevated ALT measurement 03/14/2020    Past Surgical History:  Procedure Laterality Date   WISDOM TOOTH EXTRACTION      Current Outpatient Medications  Medication Sig Dispense Refill   acetaminophen (TYLENOL) 500 MG tablet Take 500 mg by mouth every 6 (six) hours as needed for mild pain.     cetirizine (ZYRTEC ALLERGY) 10 MG tablet Take 1 tablet (10 mg total) by mouth daily. (Patient not taking: Reported on 01/24/2023) 30 tablet 0   fluticasone (FLONASE) 50 MCG/ACT nasal spray Place 2 sprays into both nostrils daily. 16 g 12   ibuprofen (ADVIL) 400 MG tablet Take 400 mg by mouth every 6 (six) hours as needed for mild pain.     pseudoephedrine (SUDAFED) 60 MG tablet Take 1 tablet (60 mg total) by mouth every 8 (eight) hours as needed for congestion. 30 tablet 0   Current Facility-Administered Medications  Medication Dose Route  Frequency Provider Last Rate Last Admin   0.9 %  sodium chloride infusion  500 mL Intravenous Once BThornton Park MD        Allergies as of 01/24/2023   (No Known Allergies)    Family History  Problem Relation Age of Onset   Colon polyps Mother    Colon polyps Father    Colon polyps Brother    Hypertension Paternal Grandmother    Colon cancer Neg Hx    Esophageal cancer Neg Hx    Stomach cancer Neg Hx    Rectal cancer Neg Hx      Physical Exam: General:   Alert,  well-nourished, pleasant and cooperative in NAD Head:  Normocephalic and atraumatic. Eyes:  Sclera clear, no icterus.   Conjunctiva pink. Mouth:  No deformity or lesions.   Neck:  Supple; no masses or thyromegaly. Lungs:  Clear throughout to auscultation.   No wheezes. Heart:  Regular rate and rhythm; no murmurs. Abdomen:  Soft, non-tender, nondistended, normal bowel sounds, no rebound or guarding.  Msk:  Symmetrical. No boney deformities LAD: No inguinal or umbilical LAD Extremities:  No clubbing or edema. Neurologic:  Alert and  oriented x4;  grossly nonfocal Skin:  No obvious rash or bruise. Psych:  Alert and cooperative. Normal mood and affect.     Studies/Results: No results found.    Theoplis Garciagarcia L. BTarri Glenn MD, MPH 01/24/2023, 2:41 PM

## 2023-01-24 NOTE — Progress Notes (Unsigned)
Called to room to assist during endoscopic procedure.  Patient ID and intended procedure confirmed with present staff. Received instructions for my participation in the procedure from the performing physician.  

## 2023-01-24 NOTE — Op Note (Signed)
[facility] Patient Name: Heather Whitney Procedure Date: 01/24/2023 2:47 PM MRN: [id number] Endoscopist: Thornton Park MD, MD, [id number] Age: 57 Referring MD:  Date of Birth: 31-Aug-1966 Gender: Female Account #: [id number] Procedure:                Colonoscopy Indications:              Rectal bleeding                           Colonoscopy in 2021 with a 2m cecal tubular adenoma                           No known family history of colon cancer or polyps Medicines:                Monitored Anesthesia Care Procedure:                Pre-Anesthesia Assessment:                           - Prior to the procedure, a History and Physical                            was performed, and patient medications and                            allergies were reviewed. The patient's tolerance of                            previous anesthesia was also reviewed. The risks                            and benefits of the procedure and the sedation                            options and risks were discussed with the patient.                            All questions were answered, and informed consent                            was obtained. Prior Anticoagulants: The patient has                            taken no anticoagulant or antiplatelet agents. ASA                            Grade Assessment: II - A patient with mild systemic                            disease. After reviewing the risks and benefits,                            the patient was deemed in satisfactory condition to  undergo the procedure.                           After obtaining informed consent, the colonoscope                            was passed under direct vision. Throughout the                            procedure, the patient's blood pressure, pulse, and                            oxygen saturations were monitored continuously. The                            Olympus [id number] SN [id number] was  introduced through                            the anus and advanced to the 3 cm into the ileum. A                            second forward view of the right colon was                            performed. The colonoscopy was performed without                            difficulty. The patient tolerated the procedure                            well. The quality of the bowel preparation was                            good. The terminal ileum, ileocecal valve,                            appendiceal orifice, and rectum were photographed. Scope In: 3:09:12 PM Scope Out: 3:23:28 PM Scope Withdrawal Time: 0 hours 8 minutes 16 seconds  Total Procedure Duration: 0 hours 14 minutes 16 seconds  Findings:                 The perianal and digital rectal examinations were                            normal.                           Non-bleeding internal hemorrhoids were found.                           A few small-mouthed diverticula were found in the                            sigmoid colon.  A 1 mm polyp was found in the ascending colon. The                            polyp was sessile. The polyp was removed with a                            cold snare. Resection and retrieval were complete.                            Estimated blood loss was minimal.                           The exam was otherwise without abnormality on                            direct and retroflexion views. Complications:            No immediate complications. Estimated Blood Loss:     Estimated blood loss was minimal. Impression:               - Non-bleeding internal hemorrhoids.                           - Mild sigmoid diverticulosis.                           - One 1 mm polyp in the ascending colon, removed                            with a cold snare. Resected and retrieved.                           - The examination was otherwise normal on direct                            and retroflexion  views. Recommendation:           - Patient has a contact number available for                            emergencies. The signs and symptoms of potential                            delayed complications were discussed with the                            patient. Return to normal activities tomorrow.                            Written discharge instructions were provided to the                            patient.                           - Resume previous diet. High  fiber diet recommended.                           - Continue present medications.                           - Await pathology results.                           - Repeat colonoscopy in 7 years for surveillance,                            earlier if needed.                           - Use hydrocortisone suppository 25 mg 1 per rectum                            twice daily as needed for bleeding.                           - Soak regularly in a warm bath or sitz bath. Soak                            your anal area in plain warm water for 10-15                            minutes 2-3 times a day. A sitz bath fits over the                            toilet. Thornton Park MD, MD 01/24/2023 3:36:36 PM This report has been signed electronically.

## 2023-01-24 NOTE — Patient Instructions (Addendum)
Handout on esophagitis, gastritis, hemorrhoids, diverticulosis, high fiber diet, and polyps given to patient. Await pathology results. Resume previous diet and continue present medications - start pantoprazole (protonix) 40 mg twice daily for at least 10 weeks and hydrocortisone suppository 25 mg 1 per rectum twice daily as needed for bleeding (sent to [facility]) Repeat colonoscopy for surveillance in 7 years, earlier if needed. Soak regularly in a warm bath or sitz bath. Soak your anal area in plain warm water for 10-15 minutes 2-3 times a day. A sitz bath fits over the toilet. No aspirin, ibuprofen, naproxen, or other non-steroidal anti-inflammatory drugs. Office follow-up in 6-8 weeks, earlier if needed.     YOU HAD AN ENDOSCOPIC PROCEDURE TODAY AT [facility]:   Refer to the procedure report that was given to you for any specific questions about what was found during the examination.  If the procedure report does not answer your questions, please call your gastroenterologist to clarify.  If you requested that your care partner not be given the details of your procedure findings, then the procedure report has been included in a sealed envelope for you to review at your convenience later.  YOU SHOULD EXPECT: Some feelings of bloating in the abdomen. Passage of more gas than usual.  Walking can help get rid of the air that was put into your GI tract during the procedure and reduce the bloating. If you had a lower endoscopy (such as a colonoscopy or flexible sigmoidoscopy) you may notice spotting of blood in your stool or on the toilet paper. If you underwent a bowel prep for your procedure, you may not have a normal bowel movement for a few days.  Please Note:  You might notice some irritation and congestion in your nose or some drainage.  This is from the oxygen used during your procedure.  There is no need for concern and it should clear up in a day or so.  SYMPTOMS TO  REPORT IMMEDIATELY:  Following lower endoscopy (colonoscopy or flexible sigmoidoscopy):  Excessive amounts of blood in the stool  Significant tenderness or worsening of abdominal pains  Swelling of the abdomen that is new, acute  Fever of 100F or higher  Following upper endoscopy (EGD)  Vomiting of blood or coffee ground material  New chest pain or pain under the shoulder blades  Painful or persistently difficult swallowing  New shortness of breath  Fever of 100F or higher  Black, tarry-looking stools  For urgent or emergent issues, a gastroenterologist can be reached at any hour by calling [phone]. Do not use MyChart messaging for urgent concerns.    DIET:  We do recommend a small meal at first, but then you may proceed to your regular diet.  Drink plenty of fluids but you should avoid alcoholic beverages for 24 hours.  ACTIVITY:  You should plan to take it easy for the rest of today and you should NOT DRIVE or use heavy machinery until tomorrow (because of the sedation medicines used during the test).    FOLLOW UP: Our staff will call the number listed on your records the next business day following your procedure.  We will call around 7:15- 8:00 am to check on you and address any questions or concerns that you may have regarding the information given to you following your procedure. If we do not reach you, we will leave a message.     If any biopsies were taken you will be contacted by phone  or by letter within the next 1-3 weeks.  Please call us at [phone] if you have not heard about the biopsies in 3 weeks.    SIGNATURES/CONFIDENTIALITY: You and/or your care partner have signed paperwork which will be entered into your electronic medical record.  These signatures attest to the fact that that the information above on your After Visit Summary has been reviewed and is understood.  Full responsibility of the confidentiality of this discharge information lies with you  and/or your care-partner.

## 2023-01-24 NOTE — Op Note (Signed)
[facility] Patient Name: Heather Whitney Procedure Date: 01/24/2023 2:48 PM MRN: [id number] Endoscopist: Thornton Park MD, MD, [id number] Age: 57 Referring MD:  Date of Birth: 1966-08-23 Gender: Female Account #: [id number] Procedure:                Upper GI endoscopy Indications:              Abdominal bloating Medicines:                Monitored Anesthesia Care Procedure:                Pre-Anesthesia Assessment:                           - Prior to the procedure, a History and Physical                            was performed, and patient medications and                            allergies were reviewed. The patient's tolerance of                            previous anesthesia was also reviewed. The risks                            and benefits of the procedure and the sedation                            options and risks were discussed with the patient.                            All questions were answered, and informed consent                            was obtained. Prior Anticoagulants: The patient has                            taken no anticoagulant or antiplatelet agents. ASA                            Grade Assessment: II - A patient with mild systemic                            disease. After reviewing the risks and benefits,                            the patient was deemed in satisfactory condition to                            undergo the procedure.                           After obtaining informed consent, the endoscope was  passed under direct vision. Throughout the                            procedure, the patient's blood pressure, pulse, and                            oxygen saturations were monitored continuously. The                            Olympus Scope [id number] was introduced through the                            mouth, and advanced to the second part of duodenum.                            The upper GI endoscopy was  accomplished without                            difficulty. The patient tolerated the procedure                            well. Scope In: Scope Out: Findings:                 LA Grade A (one or more mucosal breaks less than 5                            mm, not extending between tops of 2 mucosal folds)                            esophagitis with no bleeding was found 38 cm from                            the incisors.                           Moderate inflammation characterized by erosions,                            erythema, granularity and linear erosions was found                            in the gastric body and in the gastric antrum.                            Biopsies were taken from the antrum, body, fundus                            with a cold forceps for histology. Estimated blood                            loss was minimal.                           A  single 4 mm submucosal nodule with no bleeding                            and no stigmata of recent bleeding was found on the                            greater curvature of the stomach. The nodule was                            Paris classification Is (protruding, sessile).                            Negative pillow sign. Tunneled biopsies were taken                            with a cold forceps for histology. Estimated blood                            loss was minimal.                           The examined duodenum was normal. Biopsies were                            taken with a cold forceps for histology. Estimated                            blood loss was minimal.                           The cardia and gastric fundus were normal on                            retroflexion.                           The exam was otherwise without abnormality. Complications:            No immediate complications. Estimated Blood Loss:     Estimated blood loss was minimal. Impression:               - LA Grade A reflux esophagitis with  no bleeding.                           - Gastritis. Biopsied.                           - A single submucosal nodule found in the stomach.                            Biopsied.                           - Normal examined duodenum. Biopsied.                           -  The examination was otherwise normal. Recommendation:           - Patient has a contact number available for                            emergencies. The signs and symptoms of potential                            delayed complications were discussed with the                            patient. Return to normal activities tomorrow.                            Written discharge instructions were provided to the                            patient.                           - Resume previous diet.                           - Continue present medications.                           - Start pantoprazole 40 mg BID for at least 10                            weeks.                           - Await pathology results.                           - No aspirin, ibuprofen, naproxen, or other                            non-steroidal anti-inflammatory drugs.                           - Office follow-up in 6-8 weeks, earlier if needed. Thornton Park MD, MD 01/24/2023 3:27:17 PM This report has been signed electronically.

## 2023-01-24 NOTE — Progress Notes (Unsigned)
Vss nad trans to pacu °

## 2023-01-27 ENCOUNTER — Telehealth: Payer: Self-pay

## 2023-01-27 NOTE — Telephone Encounter (Signed)
Attempted f/u call. No answer, left VM. 

## 2023-01-31 ENCOUNTER — Encounter: Payer: Self-pay | Admitting: Gastroenterology

## 2023-03-18 ENCOUNTER — Ambulatory Visit (INDEPENDENT_AMBULATORY_CARE_PROVIDER_SITE_OTHER): Payer: BC Managed Care – PPO | Admitting: Gastroenterology

## 2023-03-18 ENCOUNTER — Other Ambulatory Visit: Payer: BC Managed Care – PPO

## 2023-03-18 ENCOUNTER — Encounter: Payer: Self-pay | Admitting: Gastroenterology

## 2023-03-18 VITALS — BP 132/90 | HR 70 | Ht 64.0 in | Wt 217.4 lb

## 2023-03-18 DIAGNOSIS — K209 Esophagitis, unspecified without bleeding: Secondary | ICD-10-CM

## 2023-03-18 DIAGNOSIS — K625 Hemorrhage of anus and rectum: Secondary | ICD-10-CM

## 2023-03-18 DIAGNOSIS — R748 Abnormal levels of other serum enzymes: Secondary | ICD-10-CM

## 2023-03-18 DIAGNOSIS — R140 Abdominal distension (gaseous): Secondary | ICD-10-CM

## 2023-03-18 NOTE — Patient Instructions (Signed)
  Your provider has requested that you go to the basement level for lab work before leaving today. Press "B" on the elevator. The lab is located at the first door on the left as you exit the elevator.  You have been scheduled for a Barium Esophogram at [facility] (1st floor of the hospital) on 04/01/2023 at 9:00am. Please arrive 30 minutes prior to your appointment for registration. Make certain not to have anything to eat or drink 3 hours prior to your test. If you need to reschedule for any reason, please contact radiology at [phone] to do so. __________________________________________________________________ A barium swallow is an examination that concentrates on views of the esophagus. This tends to be a double contrast exam (barium and two liquids which, when combined, create a gas to distend the wall of the oesophagus) or single contrast (non-ionic iodine based). The study is usually tailored to your symptoms so a good history is essential. Attention is paid during the study to the form, structure and configuration of the esophagus, looking for functional disorders (such as aspiration, dysphagia, achalasia, motility and reflux) EXAMINATION You may be asked to change into a gown, depending on the type of swallow being performed. A radiologist and radiographer will perform the procedure. The radiologist will advise you of the type of contrast selected for your procedure and direct you during the exam. You will be asked to stand, sit or lie in several different positions and to hold a small amount of fluid in your mouth before being asked to swallow while the imaging is performed .In some instances you may be asked to swallow barium coated marshmallows to assess the motility of a solid food bolus. The exam can be recorded as a digital or video fluoroscopy procedure. POST PROCEDURE It will take 1-2 days for the barium to pass through your system. To facilitate this, it is important, unless  otherwise directed, to increase your fluids for the next 24-48hrs and to resume your normal diet.  This test typically takes about 30 minutes to perform. __________________________________________________________________________________     _______________________________________________________  If your blood pressure at your visit was 140/90 or greater, please contact your primary care physician to follow up on this.  _______________________________________________________  If you are age 55 or older, your body mass index should be between 23-30. Your Body mass index is 37.32 kg/m. If this is out of the aforementioned range listed, please consider follow up with your Primary Care Provider.  If you are age 72 or younger, your body mass index should be between 19-25. Your Body mass index is 37.32 kg/m. If this is out of the aformentioned range listed, please consider follow up with your Primary Care Provider.   ________________________________________________________  The [facility] providers would like to encourage you to use [facility] to communicate with providers for non-urgent requests or questions.  Due to long hold times on the telephone, sending your provider a message by [facility] may be a faster and more efficient way to get a response.  Please allow 48 business hours for a response.  Please remember that this is for non-urgent requests.  _______________________________________________________  It was a pleasure to see you today!  Thank you for trusting me with your gastrointestinal care!

## 2023-03-18 NOTE — Progress Notes (Signed)
Referring Provider: No ref. provider found Primary Care Physician:  Patient, No Pcp Per  Chief Complaint:  Elevated LFTs, bloating, esophagitis   IMPRESSION:  Abnormal liver enzymes - elevated alk phos and ALT    - normal alk phos isoenzymes    - fatty liver on ultrasound but not confirmed by [facility] FibroSure    - negative serologies for AIH, HCV, HBV, hemochromatosis, celiac    - no fibrosis noted on elastography or [facility] FibroSure Elevated IgA without concurrent celiac disease    - 600  12/30/22    - clinical significance is unknown LA Grade A reflux esophagitis Esophageal fullness - ? Globus that is new following endoscopy Gastric nodule with biopsies showing gastropathy    - EGD with EUS recommended Intermittent painless rectal bleeding due to internal hemorrhoids Lifetime history of bloating    - may be related to esophagitis given clinical response to pantoprazole History of colon polyp    - tubular adenoma on colonoscopy 2021    - lymphoid aggregate on colonoscopy 2024    - surveillance colonoscopy recommended in 2029 given the family history Family history of colon polyps (mother, father, and brother) Family history of celiac (Mother)     PLAN: - AMA, ANA, IgM, IgG, repeat alk phos isoenzymes given the increased hepatic echogenicity - Referral to Immunologist to evaluate elevated IgA - Continue pantoprazole until at least next office visit given improvement symptoms control - Barium esophagram to evaluate esophageal fullness - Consider cross-sectional if bloating recurs - Office follow-up in 3 months, earlier if needed - Repeat EGD with Dr. Rush Landmark in May  - Referral to [facility] to establish care (requesting Dr. Quay Burow)  Please see the "Patient Instructions" section for addition details about the plan.  HPI: Heather Whitney is a 57 y.o. female presents today for follow-up.  She has been under evaluation for abnormal liver enzymes.  There was no  identified risk for chronic viral hepatitis. No family history of liver disease.  Available labs: 03/13/20: AST 33, ALT 46, alk phos 165, TB 0.5 03/16/20: AST 37, ALT 63, alk phos 161, TB 0.4 03/16/20: alk phos isoenzymes normal with bone 32%, intestinal 0%, liver 68% 04/26/20: AST 26, ALT 33, alk phos 180, TB 0.3 07/05/20: ANA + 1:80 in nuclear, homogeneous pattern, ASMA negative, IgM and IgG normal, testing for HBV and HCV negative, ferritin and iron normal, insulin 7.7, glucose 100 HOMA IR 1.9  12/30/2022: Normal CBC including platelets of 365, normal CMP except for an alk phos of 130 and a glucose of 102, TTG a negative, IgA 600, TSH 4.09 12/30/2022: [facility] showed N0, S0, F0  Abdominal ultrasound 07/10/20: hepatic steatosis, otherwise normal  Abdominal ultrasound with elastography 12/30/2022 showed an echogenic liver without focal mass or nodularity, median K PA 3.1 which is associated with a high probability of being normal  Colonoscopy 01/24/2023 for painless rectal bleeding showed nonbleeding internal hemorrhoids, sigmoid diverticulosis, and a 1 mm lymphoid aggregate.  Endoscopically it appeared like a possible polyp but biopsies were negative for adenomatous changes.  Upper endoscopy 01/24/2023 to evaluate bloating triggered by consuming citrus, spicy foods, tomatoes, all greasy foods, and raw vegetables showed LA grade a reflux esophagitis, gastritis, a 4 mm submucosal nodule along the greater curvature of the stomach, and normal duodenum.  Gastric biopsies showed nonspecific reactive gastropathy in the area of the gastric nodule.  Duodenal biopsies were normal.  Returns today in follow-up after endoscopy. Bloating has resolved, as well as  her reflux and heartburn.She has some increased esophageal awareness following her procedure but denies pain, dysphagia, neck pain and odynophagia. Awareness is intermittent and unpredictable. She has other no new complaints or concerns today.   Endoscopic  history: - Colonoscopy 07/12/20: 39mm cecal tubular adenoma. Surveillance recommended in 7 years.  - Colonoscopy 01/24/2023 for painless rectal bleeding showed nonbleeding internal hemorrhoids, sigmoid diverticulosis, and a 1 mm lymphoid aggregate.  Endoscopically it appeared like a possible polyp but biopsies were negative for adenomatous changes. - Upper endoscopy 01/24/2023 to evaluate bloating triggered by consuming citrus, spicy foods, tomatoes, all greasy foods, and raw vegetables showed LA grade a reflux esophagitis, gastritis, a 4 mm submucosal nodule along the greater curvature of the stomach, and normal duodenum.  Gastric biopsies showed nonspecific reactive gastropathy in the area of the gastric nodule.  Duodenal biopsies were normal.  Her mother, father, and brother had had colon polyps. Paternal great aunt/uncles died from colon cancer.   Past Medical History:  Diagnosis Date   Colon polyps    Elevated alkaline phosphatase level 03/14/2020   Elevated ALT measurement 03/14/2020    Past Surgical History:  Procedure Laterality Date   WISDOM TOOTH EXTRACTION      Current Outpatient Medications  Medication Sig Dispense Refill   acetaminophen (TYLENOL) 500 MG tablet Take 500 mg by mouth every 6 (six) hours as needed for mild pain.     hydrocortisone (ANUSOL-HC) 25 MG suppository Place 1 suppository (25 mg total) rectally 2 (two) times daily as needed for hemorrhoids or anal itching (bleeding). 12 suppository 0   pantoprazole (PROTONIX) 40 MG tablet Take 1 tablet (40 mg total) by mouth 2 (two) times daily. 60 tablet 2   cetirizine (ZYRTEC ALLERGY) 10 MG tablet Take 1 tablet (10 mg total) by mouth daily. (Patient not taking: Reported on 01/24/2023) 30 tablet 0   fluticasone (FLONASE) 50 MCG/ACT nasal spray Place 2 sprays into both nostrils daily. (Patient not taking: Reported on 03/18/2023) 16 g 12   pseudoephedrine (SUDAFED) 60 MG tablet Take 1 tablet (60 mg total) by mouth every 8 (eight)  hours as needed for congestion. (Patient not taking: Reported on 03/18/2023) 30 tablet 0   Current Facility-Administered Medications  Medication Dose Route Frequency Provider Last Rate Last Admin   0.9 %  sodium chloride infusion  500 mL Intravenous Once Thornton Park, MD        Allergies as of 03/18/2023   (No Known Allergies)    Family History  Problem Relation Age of Onset   Colon polyps Mother    Colon polyps Father    Colon polyps Brother    Hypertension Paternal Grandmother    Colon cancer Neg Hx    Esophageal cancer Neg Hx    Stomach cancer Neg Hx    Rectal cancer Neg Hx     Physical Exam: General:   Alert,  well-nourished, pleasant and cooperative in NAD Head:  Normocephalic and atraumatic. Eyes:  Sclera clear, no icterus.   Conjunctiva pink. Abdomen:  Soft,nontender, nondistended, normal bowel sounds, no rebound or guarding. No hepatosplenomegaly.   Neurologic:  Alert and  oriented x4;  grossly nonfocal Skin:  Intact without significant lesions or rashes. Psych:  Alert and cooperative. Normal mood and affect.   Ovide Dusek L. Tarri Glenn, MD, MPH 03/18/2023, 2:12 PM

## 2023-03-22 LAB — ALKALINE PHOSPHATASE, ISOENZYMES
Alkaline Phosphatase: 155 IU/L — ABNORMAL HIGH (ref 44–121)
BONE FRACTION: 25 % (ref 14–68)
INTESTINAL FRAC.: 0 % (ref 0–18)
LIVER FRACTION: 75 % (ref 18–85)

## 2023-03-23 LAB — IGG: IgG (Immunoglobin G), Serum: 1217 mg/dL (ref 600–1640)

## 2023-03-23 LAB — ANA: Anti Nuclear Antibody (ANA): POSITIVE — AB

## 2023-03-23 LAB — IGM: IgM, Serum: 73 mg/dL (ref 50–300)

## 2023-03-23 LAB — ANTI-NUCLEAR AB-TITER (ANA TITER): ANA Titer 1: 1:80 {titer} — ABNORMAL HIGH

## 2023-03-23 LAB — ANTI-SMOOTH MUSCLE ANTIBODY, IGG: Actin (Smooth Muscle) Antibody (IGG): <20 U (ref <20)

## 2023-03-26 ENCOUNTER — Telehealth: Payer: Self-pay | Admitting: Gastroenterology

## 2023-03-26 NOTE — Telephone Encounter (Signed)
Inbound call from patient, is requesting to speak to Irving Burton to inquire about how long she should take Protonix? Also, she would like to know exactly why she is being referred to All[facility] and also if Dr. Orvan Falconer has any recommendations on the provider she sees there? She is also wanting to follow up on referral with PCP Cheryll Cockayne. She states Dr. Orvan Falconer had spoken about sending a referral for patient to establish PCP?

## 2023-04-01 ENCOUNTER — Ambulatory Visit (HOSPITAL_COMMUNITY)
Admission: RE | Admit: 2023-04-01 | Discharge: 2023-04-01 | Disposition: A | Discharge status: Home / Self Care | Payer: BC Managed Care – PPO | Source: Ambulatory Visit | Attending: Gastroenterology | Admitting: Gastroenterology

## 2023-04-01 DIAGNOSIS — K209 Esophagitis, unspecified without bleeding: Secondary | ICD-10-CM | POA: Diagnosis not present

## 2023-04-01 DIAGNOSIS — R140 Abdominal distension (gaseous): Secondary | ICD-10-CM

## 2023-04-01 DIAGNOSIS — R1310 Dysphagia, unspecified: Secondary | ICD-10-CM | POA: Diagnosis not present

## 2023-04-01 DIAGNOSIS — K224 Dyskinesia of esophagus: Secondary | ICD-10-CM | POA: Diagnosis not present

## 2023-04-01 DIAGNOSIS — K219 Gastro-esophageal reflux disease without esophagitis: Secondary | ICD-10-CM | POA: Diagnosis not present

## 2023-04-01 DIAGNOSIS — R748 Abnormal levels of other serum enzymes: Secondary | ICD-10-CM | POA: Diagnosis not present

## 2023-04-01 DIAGNOSIS — K625 Hemorrhage of anus and rectum: Secondary | ICD-10-CM | POA: Diagnosis not present

## 2023-04-08 ENCOUNTER — Other Ambulatory Visit: Payer: Self-pay

## 2023-04-08 ENCOUNTER — Telehealth: Payer: Self-pay

## 2023-04-08 DIAGNOSIS — R140 Abdominal distension (gaseous): Secondary | ICD-10-CM

## 2023-04-08 DIAGNOSIS — R748 Abnormal levels of other serum enzymes: Secondary | ICD-10-CM

## 2023-04-08 NOTE — Telephone Encounter (Signed)
Pt has stated Dr. Carmelia Roller has referred her to you as a new pt.  Pt is wanting to know if you are willing to take her on as a new pt as she is needing lab work, etc followed up on. Pt asked that she gets a call with the response.

## 2023-04-08 NOTE — Telephone Encounter (Signed)
Okay with excepting-11 AM slot please

## 2023-04-10 NOTE — Patient Instructions (Addendum)
      Medications changes include :   Augmentin twice daily for 10 days.  Continue the claritin and use Flonase daily.  Albuterol inhaler as needed.      Physical exam later this year or early next year    [facility] Phone : [phone]

## 2023-04-10 NOTE — Progress Notes (Signed)
Subjective:    Patient ID: Heather Whitney, female    DOB: Apr 16, 1966, 57 y.o.   MRN: [id number]     HPI Joby is here to establish with a new pcp - she is here for follow up of her chronic medical problems.  Elevated IgA  - to see Dr Delorse Lek on 5/22.   Elevated ALT, gastritis/esophagitis - EGD 01/2023 on pantoprazole.  F/u with Dr  Meridee Score  Reviewed recent history-has been seeing GI for elevated alk phos and has had several tests.  Recently found to have esophagitis, gastritis and some esophageal dysmotility Has follow-up with GI scheduled.  Workup also showed elevated IgA for which she will see allergy.  Recently started having URI symptoms.  States congestion, pressure in ears, some wheezing or crackling in her chest.  Does have a history of sinus infections.  No regular exercise  Medications and allergies reviewed with patient and updated if appropriate.  Current Outpatient Medications on File Prior to Visit  Medication Sig Dispense Refill   acetaminophen (TYLENOL) 500 MG tablet Take 500 mg by mouth every 6 (six) hours as needed for mild pain.     hydrocortisone (ANUSOL-HC) 25 MG suppository Place 1 suppository (25 mg total) rectally 2 (two) times daily as needed for hemorrhoids or anal itching (bleeding). 12 suppository 0   pantoprazole (PROTONIX) 40 MG tablet Take 1 tablet (40 mg total) by mouth 2 (two) times daily. 60 tablet 2   No current facility-administered medications on file prior to visit.     Review of Systems  Constitutional:  Negative for fever.  HENT:  Positive for congestion, ear pain (ear clogged), postnasal drip and sinus pressure (nasal bridge). Negative for sinus pain and sore throat.   Respiratory:  Positive for cough (rattling). Negative for shortness of breath and wheezing.   Cardiovascular:  Negative for chest pain, palpitations and leg swelling.  Gastrointestinal:  Positive for abdominal pain.  Musculoskeletal:        Knee stiffness, back  feels weak, no other joint pain/stiffness  Neurological:  Positive for dizziness (occ with ear issues) and headaches (sinus). Negative for light-headedness.  Psychiatric/Behavioral:  Negative for dysphoric mood. The patient is not nervous/anxious.        Objective:   Vitals:   04/11/23 1112  BP: 130/76  Pulse: 78  Temp: 98.4 F (36.9 C)  SpO2: 98%   BP Readings from Last 3 Encounters:  04/11/23 130/76  03/18/23 (!) 132/90  01/24/23 (!) 145/79   Wt Readings from Last 3 Encounters:  04/11/23 217 lb (98.4 kg)  03/18/23 217 lb 6.4 oz (98.6 kg)  01/24/23 216 lb (98 kg)   Body mass index is 37.25 kg/m.    Physical Exam Constitutional:      General: She is not in acute distress.    Appearance: Normal appearance. She is not ill-appearing.  HENT:     Head: Normocephalic and atraumatic.     Right Ear: Tympanic membrane, ear canal and external ear normal.     Left Ear: Tympanic membrane, ear canal and external ear normal.     Mouth/Throat:     Mouth: Mucous membranes are moist.     Pharynx: No oropharyngeal exudate or posterior oropharyngeal erythema.  Eyes:     Conjunctiva/sclera: Conjunctivae normal.  Cardiovascular:     Rate and Rhythm: Normal rate and regular rhythm.     Heart sounds: Normal heart sounds.  Pulmonary:     Effort: Pulmonary effort is normal.  No respiratory distress.     Breath sounds: Wheezing and rales present.  Abdominal:     General: There is no distension.     Palpations: Abdomen is soft.     Tenderness: There is no abdominal tenderness.  Musculoskeletal:     Cervical back: Neck supple. No tenderness.     Right lower leg: No edema.     Left lower leg: No edema.  Lymphadenopathy:     Cervical: No cervical adenopathy.  Skin:    General: Skin is warm and dry.     Findings: No rash.  Neurological:     Mental Status: She is alert. Mental status is at baseline.  Psychiatric:        Mood and Affect: Mood normal.        Behavior: Behavior normal.         Lab Results  Component Value Date   WBC 9.3 12/30/2022   HGB 13.4 12/30/2022   HCT 39.2 12/30/2022   PLT 365.0 12/30/2022   GLUCOSE 102 (H) 12/30/2022   CHOL 232 (H) 12/30/2022   TRIG 105 12/30/2022   HDL 47 04/26/2020   LDLCALC 119 (H) 04/26/2020   ALT 12 12/30/2022   AST 13 12/30/2022   NA 139 12/30/2022   K 3.8 12/30/2022   CL 103 12/30/2022   CREATININE 0.84 12/30/2022   BUN 15 12/30/2022   CO2 26 12/30/2022   TSH 4.09 12/30/2022     Assessment & Plan:    See Problem List for Assessment and Plan of chronic medical problems.

## 2023-04-11 ENCOUNTER — Encounter: Payer: Self-pay | Admitting: Internal Medicine

## 2023-04-11 ENCOUNTER — Other Ambulatory Visit: Payer: BC Managed Care – PPO

## 2023-04-11 ENCOUNTER — Ambulatory Visit (INDEPENDENT_AMBULATORY_CARE_PROVIDER_SITE_OTHER): Payer: BC Managed Care – PPO | Admitting: Internal Medicine

## 2023-04-11 VITALS — BP 130/76 | HR 78 | Temp 98.4°F | Ht 64.0 in | Wt 217.0 lb

## 2023-04-11 DIAGNOSIS — K224 Dyskinesia of esophagus: Secondary | ICD-10-CM | POA: Diagnosis not present

## 2023-04-11 DIAGNOSIS — R140 Abdominal distension (gaseous): Secondary | ICD-10-CM

## 2023-04-11 DIAGNOSIS — J309 Allergic rhinitis, unspecified: Secondary | ICD-10-CM | POA: Diagnosis not present

## 2023-04-11 DIAGNOSIS — I872 Venous insufficiency (chronic) (peripheral): Secondary | ICD-10-CM | POA: Insufficient documentation

## 2023-04-11 DIAGNOSIS — H6991 Unspecified Eustachian tube disorder, right ear: Secondary | ICD-10-CM

## 2023-04-11 DIAGNOSIS — K2100 Gastro-esophageal reflux disease with esophagitis, without bleeding: Secondary | ICD-10-CM

## 2023-04-11 DIAGNOSIS — R748 Abnormal levels of other serum enzymes: Secondary | ICD-10-CM | POA: Diagnosis not present

## 2023-04-11 DIAGNOSIS — K219 Gastro-esophageal reflux disease without esophagitis: Secondary | ICD-10-CM | POA: Insufficient documentation

## 2023-04-11 DIAGNOSIS — J019 Acute sinusitis, unspecified: Secondary | ICD-10-CM

## 2023-04-11 DIAGNOSIS — R062 Wheezing: Secondary | ICD-10-CM | POA: Insufficient documentation

## 2023-04-11 MED ORDER — FLUTICASONE PROPIONATE 50 MCG/ACT NA SUSP: 2.0000 | Freq: Every day | NASAL | 12 refills | Status: AC

## 2023-04-11 MED ORDER — AMOXICILLIN-POT CLAVULANATE 875-125 MG PO TABS: 1.0000 | ORAL_TABLET | Freq: Two times a day (BID) | ORAL | 0 refills | Status: AC

## 2023-04-11 MED ORDER — ALBUTEROL SULFATE HFA 108 (90 BASE) MCG/ACT IN AERS: 2.0000 | INHALATION_SPRAY | Freq: Four times a day (QID) | RESPIRATORY_TRACT | 0 refills | Status: AC | PRN

## 2023-04-11 NOTE — Assessment & Plan Note (Signed)
Acute Wheezing on exam Related to URI, sinus infection Augmentin given for infection Start albuterol inhaler as needed

## 2023-04-11 NOTE — Assessment & Plan Note (Signed)
Acute on chronic Has always had issues with the right ear Has increased issues now because of acute infection Advised Sudafed, Flonase

## 2023-04-11 NOTE — Assessment & Plan Note (Signed)
Chronic in nature Advised taking Claritin daily I also advise she may need to use Flonase daily or as needed depending on her symptoms

## 2023-04-11 NOTE — Assessment & Plan Note (Signed)
Has had a bone density test done about 3 years ago-could consider having another 1 done Stressed taking a calcium and vitamin D and exercising regularly Had full GI workup

## 2023-04-11 NOTE — Assessment & Plan Note (Signed)
Acute Likely bacterial  Start Augmentin 875-125 mg BID x 10 day otc cold medications-Flonase, Sudafed Rest, fluid Call if no improvement

## 2023-04-11 NOTE — Assessment & Plan Note (Signed)
Chronic Likely related to NSAID use EGD recently shows esophagitis and gastritis Taking pantoprazole 40 mg twice daily-she did stop taking this while she is sick and advised her to restart it and be consistent with that Has follow-up with GI

## 2023-04-11 NOTE — Assessment & Plan Note (Signed)
Had swallow evaluation done recently that showed possible small hiatal hernia and esophageal dysmotility EGD recently showed gastritis and esophagitis Taking pantoprazole 40 mg twice daily-has GI follow-up

## 2023-04-17 LAB — MITOCHONDRIAL ANTIBODIES: Mitochondrial M2 Ab, IgG: <=20 U (ref <=20.0)

## 2023-04-29 ENCOUNTER — Ambulatory Visit (INDEPENDENT_AMBULATORY_CARE_PROVIDER_SITE_OTHER): Payer: BC Managed Care – PPO

## 2023-04-29 ENCOUNTER — Other Ambulatory Visit (INDEPENDENT_AMBULATORY_CARE_PROVIDER_SITE_OTHER): Payer: BC Managed Care – PPO

## 2023-04-29 ENCOUNTER — Encounter: Payer: Self-pay | Admitting: Internal Medicine

## 2023-04-29 ENCOUNTER — Ambulatory Visit (INDEPENDENT_AMBULATORY_CARE_PROVIDER_SITE_OTHER): Payer: BC Managed Care – PPO | Admitting: Internal Medicine

## 2023-04-29 ENCOUNTER — Encounter: Payer: Self-pay | Admitting: Gastroenterology

## 2023-04-29 ENCOUNTER — Ambulatory Visit (INDEPENDENT_AMBULATORY_CARE_PROVIDER_SITE_OTHER): Payer: BC Managed Care – PPO | Admitting: Gastroenterology

## 2023-04-29 VITALS — BP 128/90 | HR 72 | Ht 64.0 in | Wt 217.1 lb

## 2023-04-29 VITALS — BP 136/88 | HR 78 | Temp 98.6°F | Ht 64.0 in | Wt 217.0 lb

## 2023-04-29 DIAGNOSIS — K2100 Gastro-esophageal reflux disease with esophagitis, without bleeding: Secondary | ICD-10-CM | POA: Diagnosis not present

## 2023-04-29 DIAGNOSIS — M25512 Pain in left shoulder: Secondary | ICD-10-CM | POA: Diagnosis not present

## 2023-04-29 DIAGNOSIS — K219 Gastro-esophageal reflux disease without esophagitis: Secondary | ICD-10-CM | POA: Diagnosis not present

## 2023-04-29 DIAGNOSIS — R748 Abnormal levels of other serum enzymes: Secondary | ICD-10-CM | POA: Diagnosis not present

## 2023-04-29 DIAGNOSIS — K319 Disease of stomach and duodenum, unspecified: Secondary | ICD-10-CM

## 2023-04-29 DIAGNOSIS — R0683 Snoring: Secondary | ICD-10-CM

## 2023-04-29 DIAGNOSIS — M542 Cervicalgia: Secondary | ICD-10-CM

## 2023-04-29 DIAGNOSIS — K3189 Other diseases of stomach and duodenum: Secondary | ICD-10-CM | POA: Diagnosis not present

## 2023-04-29 DIAGNOSIS — M25562 Pain in left knee: Secondary | ICD-10-CM | POA: Diagnosis not present

## 2023-04-29 LAB — GAMMA GT: GGT: 53 U/L — ABNORMAL HIGH (ref 7–51)

## 2023-04-29 NOTE — Assessment & Plan Note (Signed)
Acute Related to fall last week Muscular tenderness Continue Tylenol Can use ice or can try heat Can also try topical medications such as Biofreeze Deferred muscle relaxer Call if no improvement

## 2023-04-29 NOTE — Assessment & Plan Note (Signed)
Acute Related to fall last week No bruising Tenderness with palpation across lower aspect of knee Likely tenderness from the impact-doubt fracture, but will get an x-ray Continue Tylenol, ice Call if symptoms do not improve/resolve

## 2023-04-29 NOTE — Progress Notes (Signed)
Subjective:    Patient ID: Heather Whitney, female    DOB: 1966-06-28, 57 y.o.   MRN: [id number]      HPI Heather Whitney is here for  Chief Complaint  Patient presents with   Fall    Fall on last Wednesday; Landed on left knee and shoulder and heard something pop in her left shoulder; Bruised on left hip and weakness with left shoulder     Larey Seat one week ago - tripped and hit left knee, left shoulder and bruised her left hip.  She heard popping in her left shoudler when she fell.  Has some neck pain and upper back muscle pain.  Has been icing and taking tylenol.    Left shoulder- a little dec ROM -movement associated with pain.  Can not lift anything heavy. A little pain radiaing to left elbow.  No hand weakness, no numbness or tingling.  Left knee - better.  Iced it a lot.  Not bruised.  Walking ok.     No N/T.  No hand weakness.     Snores, she thinks she may have sleep apnea.  She meant to discuss that her labs visit.  She wondered about having this evaluated.  She does note that she feels tired after getting a good night sleep.  Several family members have sleep apnea.  Medications and allergies reviewed with patient and updated if appropriate.  Current Outpatient Medications on File Prior to Visit  Medication Sig Dispense Refill   acetaminophen (TYLENOL) 500 MG tablet Take 500 mg by mouth every 6 (six) hours as needed for mild pain.     albuterol (VENTOLIN HFA) 108 (90 Base) MCG/ACT inhaler Inhale 2 puffs into the lungs every 6 (six) hours as needed for wheezing or shortness of breath. 8 g 0   fluticasone (FLONASE) 50 MCG/ACT nasal spray Place 2 sprays into both nostrils daily. 16 g 12   hydrocortisone (ANUSOL-HC) 25 MG suppository Place 1 suppository (25 mg total) rectally 2 (two) times daily as needed for hemorrhoids or anal itching (bleeding). 12 suppository 0   loratadine (CLARITIN) 10 MG tablet Take 10 mg by mouth daily.     pantoprazole (PROTONIX) 40 MG tablet Take 1 tablet  (40 mg total) by mouth 2 (two) times daily. 60 tablet 2   No current facility-administered medications on file prior to visit.    Review of Systems     Objective:   Vitals:   04/29/23 1042  BP: 136/88  Pulse: 78  Temp: 98.6 F (37 C)  SpO2: 96%   BP Readings from Last 3 Encounters:  04/29/23 136/88  04/11/23 130/76  03/18/23 (!) 132/90   Wt Readings from Last 3 Encounters:  04/29/23 217 lb (98.4 kg)  04/11/23 217 lb (98.4 kg)  03/18/23 217 lb 6.4 oz (98.6 kg)   Body mass index is 37.25 kg/m.    Physical Exam Constitutional:      General: She is not in acute distress.    Appearance: Normal appearance. She is not ill-appearing.  HENT:     Head: Normocephalic and atraumatic.  Musculoskeletal:        General: No deformity.     Right lower leg: No edema.     Left lower leg: No edema.     Comments: Left knee tender to palpation lower aspect of knee joint, no posterior knee pain.  No calf pain.  She is wearing hands and unable to if there is swelling or effusion.  She reports  no bruising.  Left shoulder without tenderness with palpation throughout joint.  Decreased range of motion with lifting arm secondary to pain.  No upper arm pain with palpation.  Mild tenderness upper back and neck muscles  Neurological:     Mental Status: She is alert.     Sensory: No sensory deficit.     Motor: No weakness.            Assessment & Plan:    See Problem List for Assessment and Plan of chronic medical problems.    I spent 20 minutes dedicated to the care of this patient on the date of this encounter including review of obtaining history, communicating with the patient, ordering referrals, tests, and documenting clinical information in the EHR

## 2023-04-29 NOTE — Assessment & Plan Note (Signed)
Acute Related to fall last week No swelling, no pain with palpation of shoulder Occasional popping with movement, slight decreased range of motion secondary to pain especially when lifting arm No hand weakness, numbness or tingling Continue ice, Tylenol X-ray today Discussed possible rotator cuff injury She will give it more time If symptoms or not improving consider sports medicine referral for physical therapy

## 2023-04-29 NOTE — Patient Instructions (Signed)
Your provider has requested that you go to the basement level for lab work before leaving today. Press "B" on the elevator. The lab is located at the first door on the left as you exit the elevator.  You have been scheduled for an endoscopy. Please follow written instructions given to you at your visit today. If you use inhalers (even only as needed), please bring them with you on the day of your procedure.  _______________________________________________________  If your blood pressure at your visit was 140/90 or greater, please contact your primary care physician to follow up on this.  _______________________________________________________  If you are age 55 or older, your body mass index should be between 23-30. Your Body mass index is 37.27 kg/m. If this is out of the aforementioned range listed, please consider follow up with your Primary Care Provider.  If you are age 31 or younger, your body mass index should be between 19-25. Your Body mass index is 37.27 kg/m. If this is out of the aformentioned range listed, please consider follow up with your Primary Care Provider.   ________________________________________________________  The [facility] providers would like to encourage you to use [facility] to communicate with providers for non-urgent requests or questions.  Due to long hold times on the telephone, sending your provider a message by [facility] may be a faster and more efficient way to get a response.  Please allow 48 business hours for a response.  Please remember that this is for non-urgent requests.  _______________________________________________________  Thank you for choosing me and [facility].  Dr. Meridee Score

## 2023-04-29 NOTE — Assessment & Plan Note (Signed)
Chronic States she snores and her sleep is not always refreshing Concern for possible sleep apnea She has several family members with sleep apnea Referral to neurology for further evaluation

## 2023-04-29 NOTE — Patient Instructions (Addendum)
     Have xrays today downstairs.     Medications changes include :   none     Return if symptoms worsen or fail to improve.

## 2023-04-30 LAB — HEPATITIS B SURFACE ANTIBODY,QUALITATIVE: Hep B S Ab: REACTIVE — AB

## 2023-04-30 LAB — ALKALINE PHOSPHATASE, ISOENZYMES

## 2023-05-03 ENCOUNTER — Encounter: Payer: Self-pay | Admitting: Gastroenterology

## 2023-05-03 DIAGNOSIS — K3189 Other diseases of stomach and duodenum: Secondary | ICD-10-CM | POA: Insufficient documentation

## 2023-05-03 NOTE — Progress Notes (Signed)
GASTROENTEROLOGY OUTPATIENT CLINIC VISIT   Primary Care Provider Heather Sanes, MD [address] [place] [address] [phone]   Patient Profile: Heather Whitney is a 57 y.o. 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  The patient presents to the [facility] for an evaluation and management of problem(s) noted below:  Problem List 1. Elevated alkaline phosphatase level   2. Subepithelial gastric lesion   3. Gastroesophageal reflux disease with esophagitis without hemorrhage     History of Present Illness Please see prior notes for full details of HPI.  Interval History This is a patient previously being followed by Dr. Orvan Whitney.  She has been sent to me to discuss recent endoscopy with finding of a gastric subepithelial lesion and need for possible EUS.  Patient was also found to have grade a esophagitis.  She had no evidence of H. pylori infection.  She has a longer standing history of persistent elevated alkaline phosphatase of unclear etiology.  She has had discrepancy between her FibroSure and elastography.  She has not had any other changes in her chronic symptoms.  The patient denies any issues with jaundice, scleral icterus, generalized pruritus, darkened/amber urine, clay-colored stools, LE edema, hematemesis, coffee-ground emesis, abdominal distention, confusion.  GI Review of Systems Positive as above Negative for dysphagia, pain, melena, hematochezia  Review of Systems General: Denies fevers/chills/weight loss unintentionally Cardiovascular: Denies chest pain Pulmonary: Denies shortness of breath Gastroenterological: See HPI Genitourinary: Denies darkened urine Hematological: Denies easy bruising/bleeding Dermatological: Denies jaundice Psychological: Mood is stable   Medications Current Outpatient  Medications  Medication Sig Dispense Refill   acetaminophen (TYLENOL) 500 MG tablet Take 500 mg by mouth every 6 (six) hours as needed for mild pain.     albuterol (VENTOLIN HFA) 108 (90 Base) MCG/ACT inhaler Inhale 2 puffs into the lungs every 6 (six) hours as needed for wheezing or shortness of breath. 8 g 0   fluticasone (FLONASE) 50 MCG/ACT nasal spray Place 2 sprays into both nostrils daily. 16 g 12   hydrocortisone (ANUSOL-HC) 25 MG suppository Place 1 suppository (25 mg total) rectally 2 (two) times daily as needed for hemorrhoids or anal itching (bleeding). 12 suppository 0   loratadine (CLARITIN) 10 MG tablet Take 10 mg by mouth daily.     pantoprazole (PROTONIX) 40 MG tablet Take 1 tablet (40 mg total) by mouth 2 (two) times daily. 60 tablet 2   No current facility-administered medications for this visit.    Allergies No Known Allergies  Histories Past Medical History:  Diagnosis Date   Colon polyps    Elevated alkaline phosphatase level 03/14/2020   Elevated ALT measurement 03/14/2020   Past Surgical History:  Procedure Laterality Date   WISDOM TOOTH EXTRACTION     Social History   Socioeconomic History   Marital status: Married    Spouse name: Not on file   Number of children: 0   Years of education: Not on file   Highest education level: Not on file  Occupational History   Not on file  Tobacco Use   Smoking status: Never   Smokeless tobacco: Never  Vaping Use   Vaping Use: Never used  Substance and Sexual Activity   Alcohol use: Yes    Comment: occasional    Drug use: Never   Sexual activity: Not on file  Other Topics Concern   Not on file  Social  History Narrative   Not on file   Social Determinants of Health   Financial Resource Strain: Not on file  Food Insecurity: Not on file  Transportation Needs: Not on file  Physical Activity: Not on file  Stress: Not on file  Social Connections: Not on file  Intimate Partner Violence: Not on file    Family History  Problem Relation Age of Onset   Colon polyps Mother    Atrial fibrillation Mother    Hypertension Father    Colon polyps Father    Colon polyps Brother    Hypertension Paternal Grandmother    Colon cancer Neg Hx    Esophageal cancer Neg Hx    Stomach cancer Neg Hx    Rectal cancer Neg Hx    Inflammatory bowel disease Neg Hx    Liver disease Neg Hx    Pancreatic cancer Neg Hx    I have reviewed her medical, social, and family history in detail and updated the electronic medical record as necessary.    PHYSICAL EXAMINATION  BP (!) 128/90 (BP Location: Left Arm, Patient Position: Sitting, Cuff Size: Large)   Pulse 72   Ht 5\' 4"  (1.626 m)   Wt 217 lb 2 oz (98.5 kg)   LMP 11/16/2019   BMI 37.27 kg/m  Wt Readings from Last 3 Encounters:  04/29/23 217 lb 2 oz (98.5 kg)  04/29/23 217 lb (98.4 kg)  04/11/23 217 lb (98.4 kg)  GEN: NAD, appears stated age, doesn't appear chronically ill PSYCH: Cooperative, without pressured speech EYE: Conjunctivae pink, sclerae anicteric ENT: MMM CV: Nontachycardic RESP: No audible wheezing GI: NABS, soft, rounded, protuberant abdomen, NT, without rebound MSK/EXT: No significant lower extremity edema SKIN: No jaundice, no spider angiomata NEURO:  Alert & Oriented x 3, no focal deficits   REVIEW OF DATA  I reviewed the following data at the time of this encounter:  GI Procedures and Studies  February 2024 EGD - LA Grade A reflux esophagitis with no bleeding. - Gastritis. Biopsied. - A single submucosal nodule found in the stomach. Biopsied. - Normal examined duodenum. Biopsied. - The examination was otherwise normal.  Laboratory Studies  Reviewed those in epic  Imaging Studies  January 2024 ultrasound elastography IMPRESSION: ULTRASOUND ABDOMEN: Probable fatty infiltration of liver as above. Remainder of exam unremarkable. ULTRASOUND HEPATIC ELASTOGRAPHY: Median kPa:  3.1 Diagnostic category:  < or = 5 kPa: high  probability of being normal   ASSESSMENT  Heather Whitney is a 57 y.o. 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  The patient is seen today for evaluation and management of:  1. Elevated alkaline phosphatase level   2. Subepithelial gastric lesion   3. Gastroesophageal reflux disease with esophagitis without hemorrhage    The patient is hemodynamically and clinically stable.  She is an appropriate candidate to further evaluate the gastric subepithelial lesion.  The risks of an EUS including intestinal perforation, bleeding, infection, aspiration, and medication effects were discussed as was the possibility it may not give a definitive diagnosis if a biopsy is performed.  When a biopsy of the pancreas is done as part of the EUS, there is an additional risk of pancreatitis at the rate of about 1-2%.  It was explained that procedure related pancreatitis is typically mild, although it can be severe and even life threatening, which is why we do not perform random pancreatic biopsies and only biopsy  a lesion/area we feel is concerning enough to warrant the risk.  We do not anticipate finding anything in the pancreas but is important to know that.  The risks and benefits of endoscopic evaluation were discussed with the patient; these include but are not limited to the risk of perforation, infection, bleeding, missed lesions, lack of diagnosis, severe illness requiring hospitalization, as well as anesthesia and sedation related illnesses.  The patient and/or family is agreeable to proceed.  The EUS will also help [place] evaluate the elevated alkaline phosphatase.  The endoscopic portion will ensure that her esophagitis is healed.  If we do not find a reason for potential persistent elevated alkaline phosphatase, then the patient will need a liver biopsy.  All patient questions were answered to  the best of my ability, and the patient agrees to the aforementioned plan of action with follow-up as indicated.   PLAN  Laboratories as outlined below Proceed with scheduling EGD/EUS to follow-up esophagitis and evaluate gastric subepithelial lesion If unremarkable then will need to consider liver biopsy for patient   Orders Placed This Encounter  Procedures   Procedural/ Surgical Case Request: UPPER ESOPHAGEAL ENDOSCOPIC ULTRASOUND (EUS)   Alkaline Phosphatase, Isoenzymes   Hepatitis B Surface AntiBODY   Gamma GT   Ambulatory referral to Gastroenterology    New Prescriptions   No medications on file   Modified Medications   No medications on file    Planned Follow Up No follow-ups on file.   Total Time in Face-to-Face and in Coordination of Care for patient including independent/personal interpretation/review of prior testing, medical history, examination, medication adjustment, communicating results with the patient directly, and documentation within the EHR is 25 minutes.   Corliss Parish, MD [facility] Advanced Endoscopy Office # [phone]

## 2023-05-06 LAB — ALKALINE PHOSPHATASE, ISOENZYMES
Alkaline Phosphatase: 148 IU/L — ABNORMAL HIGH (ref 44–121)
INTESTINAL FRAC.: 5 % (ref 0–18)
LIVER FRACTION: 61 % (ref 18–85)

## 2023-05-07 ENCOUNTER — Other Ambulatory Visit: Payer: Self-pay

## 2023-05-07 ENCOUNTER — Ambulatory Visit (INDEPENDENT_AMBULATORY_CARE_PROVIDER_SITE_OTHER): Payer: BC Managed Care – PPO | Admitting: Allergy

## 2023-05-07 ENCOUNTER — Encounter: Payer: Self-pay | Admitting: Allergy

## 2023-05-07 VITALS — BP 120/76 | HR 68 | Temp 98.2°F | Resp 18 | Ht 64.0 in | Wt 216.0 lb

## 2023-05-07 DIAGNOSIS — T781XXD Other adverse food reactions, not elsewhere classified, subsequent encounter: Secondary | ICD-10-CM

## 2023-05-07 DIAGNOSIS — J329 Chronic sinusitis, unspecified: Secondary | ICD-10-CM | POA: Diagnosis not present

## 2023-05-07 DIAGNOSIS — R768 Other specified abnormal immunological findings in serum: Secondary | ICD-10-CM

## 2023-05-07 DIAGNOSIS — J988 Other specified respiratory disorders: Secondary | ICD-10-CM

## 2023-05-07 DIAGNOSIS — H6991 Unspecified Eustachian tube disorder, right ear: Secondary | ICD-10-CM

## 2023-05-07 NOTE — Progress Notes (Unsigned)
New Patient Note  RE: Heather Whitney MRN: [id number] DOB: 1966/07/12 Date of Office Visit: 05/07/2023  Primary care provider: Pincus Sanes, MD  Chief Complaint: abnormal lab  History of present illness: Heather Whitney is a 57 y.o. female presenting today for evaluation of elevated IgA.    She states she has had regular coloscopies.  She states her first conloscopy she had a polyp removed.  She had a repeat colonoscopy in Jan '24 and she had a UGD and was found to have a gastric lesion as well as reflux esophagitis.  She also now has history of hepatic steatosis on imaging as well as elevated alk phos without a clear cause.  With all of this she has been following with gastroenterology.  For the gastric lesion she states she has a scheduled removal in July. With all of her laboratory and procedural studies done she was found to have an elevated IgA (600 from 1/24).      Symptoms wise she states she has been having bloating.  She also reports having a pain under her rib cage on right side after eating as well as a gurgling feeling in stomach.  She has been told from her previous studies that there is no indication of gallbladder disease. She does take protonix twice a day since Feb.  She denies having fever.  She does report feeling hot and cold related to her age and hormonal change.  She does feel like she has had weight gain.  No change in appetite.  She does not have recurrent infections and has not required IV antibiotics.  She states over the winter she had congestion that led to cough with bronchitis. She has had wheezing with URI noted by PCP.   She has been prescribed an albuterol inhaler due to this.  She had bronchitis twice this winter.  She states she has food allergy: Avoids - tomato, citrus, pineapple, greasy foods, spicy foods, oily foods, avocado, some nuts black walnuts (can eat peanuts). She states she does get congestion, headaches.  With her ears she has had fluid in the  ear and has had antibiotic to help clear this.  She reports having ringing in the ear that is constant for years, R>L).   She takes clairtin that seems ot help her more than any other antihistamine and takes as needed.  She will use flonase but is "not really big on nasal sprays".   Review of systems: Review of Systems  Constitutional: Negative.   HENT:         See HPI  Eyes: Negative.   Respiratory: Negative.    Cardiovascular: Negative.   Gastrointestinal:        See HPI  Musculoskeletal: Negative.   Skin: Negative.   Allergic/Immunologic: Negative.   Neurological: Negative.     All other systems negative unless noted above in HPI  Past medical history: Past Medical History:  Diagnosis Date   Colon polyps    Elevated alkaline phosphatase level 03/14/2020   Elevated ALT measurement 03/14/2020    Past surgical history: Past Surgical History:  Procedure Laterality Date   WISDOM TOOTH EXTRACTION      Family history:  Family History  Problem Relation Age of Onset   Allergic rhinitis Mother    Colon polyps Mother    Atrial fibrillation Mother    Hypertension Father    Colon polyps Father    Allergic rhinitis Sister    Allergic rhinitis Sister    Allergic  rhinitis Sister    Allergic rhinitis Sister    Allergic rhinitis Brother    Colon polyps Brother    Allergic rhinitis Brother    Asthma Brother    Hypertension Paternal Grandmother    Colon cancer Neg Hx    Esophageal cancer Neg Hx    Stomach cancer Neg Hx    Rectal cancer Neg Hx    Inflammatory bowel disease Neg Hx    Liver disease Neg Hx    Pancreatic cancer Neg Hx     Social history: Lives in a home without carpeting with gas heating and central cooling.  No pets in the home.  There is no concern for water damage, mildew or roaches in the home.  Does not report a smoking history.  Does not report an occupation at this time.   Medication List: Current Outpatient Medications  Medication Sig Dispense  Refill   acetaminophen (TYLENOL) 500 MG tablet Take 500 mg by mouth every 6 (six) hours as needed for mild pain.     calcium carbonate (OSCAL) 1500 (600 Ca) MG TABS tablet Take 1,200 mg by mouth daily with breakfast.     cholecalciferol (VITAMIN D3) 25 MCG (1000 UNIT) tablet Take 2,000 Units by mouth daily.     loratadine (CLARITIN) 10 MG tablet Take 10 mg by mouth daily.     albuterol (VENTOLIN HFA) 108 (90 Base) MCG/ACT inhaler Inhale 2 puffs into the lungs every 6 (six) hours as needed for wheezing or shortness of breath. (Patient not taking: Reported on 05/07/2023) 8 g 0   fluticasone (FLONASE) 50 MCG/ACT nasal spray Place 2 sprays into both nostrils daily. (Patient not taking: Reported on 05/07/2023) 16 g 12   pantoprazole (PROTONIX) 40 MG tablet Take 1 tablet (40 mg total) by mouth 2 (two) times daily. 60 tablet 2   No current facility-administered medications for this visit.    Known medication allergies: No Known Allergies   Physical examination: Blood pressure 120/76, pulse 68, temperature 98.2 F (36.8 C), temperature source Temporal, resp. rate 18, height 5\' 4"  (1.626 m), weight 216 lb (98 kg), last menstrual period 11/16/2019, SpO2 96 %.  General: Alert, interactive, in no acute distress. HEENT: PERRLA, TMs pearly gray, turbinates non-edematous without discharge, post-pharynx non erythematous. Neck: Supple without lymphadenopathy. Lungs: Clear to auscultation without wheezing, rhonchi or rales. {no increased work of breathing. CV: Normal S1, S2 without murmurs. Abdomen: Nondistended, nontender. Skin: Warm and dry, without lesions or rashes. Extremities:  No clubbing, cyanosis or edema. Neuro:   Grossly intact.  Diagnositics/Labs: Labs: Component     Latest Ref Rng 12/30/2022 03/18/2023  Fibrosis Score     0.00 - 0.21  0.11    Fibrosis Stage Comment    Steatosis Score     0.00 - 0.40  0.36    Steatosis Grade Comment    NASH Score     0.00 - 0.25  0.00    NASH Grade  Comment    Methodology Comment    ALPHA 2-MACROGLOBULINS, QN     110 - 276 mg/dL 161    Haptoglobin     33 - 346 mg/dL 096    Apolipoprotein A-1     116 - 209 mg/dL 045    Bilirubin, Total     0.0 - 1.2 mg/dL 0.2    GGT     7 - 51 U/L 23    ALT (SGPT) P5P     0 - 40 IU/L 14  AST (SGOT) P5P     0 - 40 IU/L 18    Cholesterol, Total     100 - 199 mg/dL 161 (H)    Glucose     70 - 99 mg/dL 096 (H)    Glucose     70 - 99 mg/dL 045 (H)    Triglycerides     0 - 149 mg/dL 409    Interpretations Comment    Fibrosis Scoring: Comment    Steatosis Scoring Comment    NASH Scoring Comment    Limitations Comment    Comment Comment    WBC     4.0 - 10.5 K/uL 9.3    RBC     3.87 - 5.11 Mil/uL 4.53    Hemoglobin     12.0 - 15.0 g/dL 81.1    HCT     91.4 - 46.0 % 39.2    MCV     78.0 - 100.0 fl 86.5    MCHC     30.0 - 36.0 g/dL 78.2    RDW     95.6 - 15.5 % 13.2    Platelets     150.0 - 400.0 K/uL 365.0    Neutrophils     43.0 - 77.0 % 63.2    Lymphocytes     12.0 - 46.0 % 27.6    Monocytes Relative     3.0 - 12.0 % 6.1    Eosinophil     0.0 - 5.0 % 1.7    Basophil     0.0 - 3.0 % 1.4    NEUT#     1.4 - 7.7 K/uL 5.8    Lymphocyte #     0.7 - 4.0 K/uL 2.6    Monocyte #     0.1 - 1.0 K/uL 0.6    Eosinophils Absolute     0.0 - 0.7 K/uL 0.2    Basophils Absolute     0.0 - 0.1 K/uL 0.1    Sodium     135 - 145 mEq/L 139    Potassium     3.5 - 5.1 mEq/L 3.8    Chloride     96 - 112 mEq/L 103    CO2     19 - 32 mEq/L 26    BUN     6 - 23 mg/dL 15    Creatinine     2.13 - 1.20 mg/dL 0.86    Total Bilirubin     0.2 - 1.2 mg/dL 0.4    Alkaline Phosphatase     44 - 121 IU/L 130 (H)  155 (H)   AST     0 - 37 U/L 13    ALT     0 - 35 U/L 12    Total Protein     6.0 - 8.3 g/dL 7.5    Albumin     3.5 - 5.2 g/dL 4.2    GFR     >57.84 mL/min 78.65    Calcium     8.4 - 10.5 mg/dL 9.2    LIVER FRACTION     18 - 85 %  75   BONE FRACTION     14 - 68 %  25    INTESTINAL FRAC.     0 - 18 %  0   (tTG) Ab, IgG     U/mL <1.0    (tTG) Ab, IgA     U/mL <1.0    ANA Titer 1  titer  1:80 (H)   ANA Pattern 1  Nuclear, Homogeneous !   Immunoglobulin A     47 - 310 mg/dL 098 (H)    TSH     1.19 - 5.50 uIU/mL 4.09    IgM, Serum     50 - 300 mg/dL  73   IgG (Immunoglobin G), Serum     600 - 1,640 mg/dL  1,478   Anti Nuclear Antibody (ANA)     NEGATIVE   POSITIVE !   Actin (Smooth Muscle) Antibody (IGG)     <20 U  <20   Mitochondrial M2 Ab, IgG     <=20.0 U    Hep B S Ab     NON-REACTIVE      Component     Latest Ref Rng 04/11/2023 04/29/2023  Fibrosis Score     0.00 - 0.21     Fibrosis Stage    Steatosis Score     0.00 - 0.40     Steatosis Grade    NASH Score     0.00 - 0.25     NASH Grade    Methodology    ALPHA 2-MACROGLOBULINS, QN     110 - 276 mg/dL    Haptoglobin     33 - 346 mg/dL    Apolipoprotein A-1     116 - 209 mg/dL    Bilirubin, Total     0.0 - 1.2 mg/dL    GGT     7 - 51 U/L  53 (H)   ALT (SGPT) P5P     0 - 40 IU/L    AST (SGOT) P5P     0 - 40 IU/L    Cholesterol, Total     100 - 199 mg/dL    Glucose     70 - 99 mg/dL    Glucose     70 - 99 mg/dL    Triglycerides     0 - 149 mg/dL    Interpretations    Fibrosis Scoring:    Steatosis Scoring    NASH Scoring    Limitations    Comment    WBC     4.0 - 10.5 K/uL    RBC     3.87 - 5.11 Mil/uL    Hemoglobin     12.0 - 15.0 g/dL    HCT     29.5 - 62.1 %    MCV     78.0 - 100.0 fl    MCHC     30.0 - 36.0 g/dL    RDW     30.8 - 65.7 %    Platelets     150.0 - 400.0 K/uL    Neutrophils     43.0 - 77.0 %    Lymphocytes     12.0 - 46.0 %    Monocytes Relative     3.0 - 12.0 %    Eosinophil     0.0 - 5.0 %    Basophil     0.0 - 3.0 %    NEUT#     1.4 - 7.7 K/uL    Lymphocyte #     0.7 - 4.0 K/uL    Monocyte #     0.1 - 1.0 K/uL    Eosinophils Absolute     0.0 - 0.7 K/uL    Basophils Absolute     0.0 - 0.1 K/uL    Sodium     135 -  145 mEq/L  Potassium     3.5 - 5.1 mEq/L    Chloride     96 - 112 mEq/L    CO2     19 - 32 mEq/L    BUN     6 - 23 mg/dL    Creatinine     8.65 - 1.20 mg/dL    Total Bilirubin     0.2 - 1.2 mg/dL    Alkaline Phosphatase     44 - 121 IU/L  148 (H)   AST     0 - 37 U/L    ALT     0 - 35 U/L    Total Protein     6.0 - 8.3 g/dL    Albumin     3.5 - 5.2 g/dL    GFR     >78.46 mL/min    Calcium     8.4 - 10.5 mg/dL    LIVER FRACTION     18 - 85 %  61   BONE FRACTION     14 - 68 %  34   INTESTINAL FRAC.     0 - 18 %  5   (tTG) Ab, IgG     U/mL    (tTG) Ab, IgA     U/mL    ANA Titer 1     titer    ANA Pattern 1    Immunoglobulin A     47 - 310 mg/dL    TSH     9.62 - 9.52 uIU/mL    IgM, Serum     50 - 300 mg/dL    IgG (Immunoglobin G), Serum     600 - 1,640 mg/dL    Anti Nuclear Antibody (ANA)     NEGATIVE     Actin (Smooth Muscle) Antibody (IGG)     <20 U    Mitochondrial M2 Ab, IgG     <=20.0 U <=20.0    Hep B S Ab     NON-REACTIVE   REACTIVE !     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   Given your IgA has been double the upper limit of normal recommend further work-up to determine if there is a cause for the isolated IgA elevation.   Will repeat your antibody levels as well protein electrophoresis both blood and urine studies.  If these are abnormal then would place referral for heme/onc for further evaluation.    - you have also had a positive ANA which is a autoimmune marker however it can be positive in a percentage of people at baseline without autoimmune disease.  With elevated IgA it also may be reasonable for a formal rheumatologic evaluation if testing above is negative   - recommend trial of Xhance 1-2 sprays each nostril twice a day.  Proper demonstration shown.  This is a  special spray device that allows for deeper deposition of the medication into the sinus track.  It has the best potential to impact your ear symptoms.  Let us know if this spray is helpful for you and we can send in prescription.   Hold Flonase while using Xhance. - continue Claritin as needed use.   - have access to albuterol inhaler 2 puffs every 4-6 hours as needed for cough/wheeze/shortness of breath/chest tightness.   Monitor frequency of use.  Use with illness if needed  Follow-up in 3-4 months or sooner if needed  I appreciate the opportunity to take part in Heather Whitney's care. Please do not hesitate to contact me with questions.  Sincerely,   Margo Aye, MD Allergy/Immunology [facility]

## 2023-05-07 NOTE — Patient Instructions (Addendum)
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   Given your IgA has been double the upper limit of normal recommend further work-up to determine if there is a cause for the isolated IgA elevation.   Will repeat your antibody levels as well protein electrophoresis both blood and urine studies.  If these are abnormal then would place referral for heme/onc for further evaluation.    - you have also had a positive ANA which is a autoimmune marker however it can be positive in a percentage of people at baseline without autoimmune disease.  With elevated IgA it also may be reasonable for a formal rheumatologic evaluation if testing above is negative   - recommend trial of Xhance 1-2 sprays each nostril twice a day.  Proper demonstration shown.  This is a special spray device that allows for deeper deposition of the medication into the sinus track.  It has the best potential to impact your ear symptoms.  Let us know if this spray is helpful for you and we can send in prescription.   Hold Flonase while using Xhance. - continue Claritin as needed use.   - have access to albuterol inhaler 2 puffs every 4-6 hours as needed for cough/wheeze/shortness of breath/chest tightness.   Monitor frequency of use.  Use with illness if needed  Follow-up in 3-4 months or sooner if needed

## 2023-05-08 LAB — CBC WITH DIFFERENTIAL
Eos: 1 %
Immature Grans (Abs): 0 10*3/uL (ref 0.0–0.1)
Immature Granulocytes: 1 %
Lymphocytes Absolute: 2.3 10*3/uL (ref 0.7–3.1)
Neutrophils Absolute: 5.7 10*3/uL (ref 1.4–7.0)
WBC: 8.7 10*3/uL (ref 3.4–10.8)

## 2023-05-08 LAB — ALLERGENS W/TOTAL IGE AREA 2

## 2023-05-08 LAB — IGG, IGA, IGM: IgM (Immunoglobulin M), Srm: 67 mg/dL (ref 26–217)

## 2023-05-08 LAB — COMPREHENSIVE METABOLIC PANEL
ALT: 22 IU/L (ref 0–32)
Albumin: 4.5 g/dL (ref 3.8–4.9)
Alkaline Phosphatase: 159 IU/L — ABNORMAL HIGH (ref 44–121)
Potassium: 4.1 mmol/L (ref 3.5–5.2)

## 2023-05-08 LAB — PE AND FLC, SERUM: Ig Lambda Free Light Chain: 19.2 mg/L (ref 5.7–26.3)

## 2023-05-09 DIAGNOSIS — R768 Other specified abnormal immunological findings in serum: Secondary | ICD-10-CM | POA: Diagnosis not present

## 2023-05-10 LAB — CBC WITH DIFFERENTIAL
Lymphs: 27 %
MCH: 29.5 pg (ref 26.6–33.0)
MCHC: 33.3 g/dL (ref 31.5–35.7)
MCV: 88 fL (ref 79–97)
Monocytes Absolute: 0.5 10*3/uL (ref 0.1–0.9)

## 2023-05-10 LAB — ALLERGEN, ORANGE F33: Orange: <0.1 kU/L

## 2023-05-10 LAB — PE AND FLC, SERUM

## 2023-05-10 LAB — ALLERGENS W/TOTAL IGE AREA 2
Cat Dander IgE: <0.1 kU/L
Cedar, Mountain IgE: <0.1 kU/L
Dog Dander IgE: <0.1 kU/L
Mouse Urine IgE: <0.1 kU/L

## 2023-05-10 LAB — COMPREHENSIVE METABOLIC PANEL
AST: 16 IU/L (ref 0–40)
Calcium: 10.1 mg/dL (ref 8.7–10.2)
Creatinine, Ser: 0.6 mg/dL (ref 0.57–1.00)
Glucose: 89 mg/dL (ref 70–99)

## 2023-05-12 LAB — COMPREHENSIVE METABOLIC PANEL
Albumin/Globulin Ratio: 1.5 (ref 1.2–2.2)
BUN/Creatinine Ratio: 17 (ref 9–23)
BUN: 10 mg/dL (ref 6–24)
Bilirubin Total: 0.4 mg/dL (ref 0.0–1.2)
CO2: 23 mmol/L (ref 20–29)
Chloride: 103 mmol/L (ref 96–106)
Globulin, Total: 3.1 g/dL (ref 1.5–4.5)
Sodium: 141 mmol/L (ref 134–144)
eGFR: 106 mL/min/{1.73_m2} (ref >59)

## 2023-05-12 LAB — CBC WITH DIFFERENTIAL
Basophils Absolute: 0.1 10*3/uL (ref 0.0–0.2)
Basos: 1 %
EOS (ABSOLUTE): 0.1 10*3/uL (ref 0.0–0.4)
Hematocrit: 40.2 % (ref 34.0–46.6)
Hemoglobin: 13.4 g/dL (ref 11.1–15.9)
Monocytes: 5 %
Neutrophils: 65 %
RBC: 4.55 x10E6/uL (ref 3.77–5.28)
RDW: 13.1 % (ref 11.7–15.4)

## 2023-05-12 LAB — ALLERGENS W/TOTAL IGE AREA 2
Alternaria Alternata IgE: 1.15 kU/L — AB
Cockroach, German IgE: 0.18 kU/L — AB
Common Silver Birch IgE: <0.1 kU/L
D Farinae IgE: 0.27 kU/L — AB
IgE (Immunoglobulin E), Serum: 97 IU/mL (ref 6–495)
Oak, White IgE: <0.1 kU/L
Pecan, Hickory IgE: <0.1 kU/L
Pigweed, Rough IgE: <0.1 kU/L
Sheep Sorrel IgE Qn: <0.1 kU/L
Timothy Grass IgE: <0.1 kU/L

## 2023-05-12 LAB — ALLERGEN, PINEAPPLE, F210: Pineapple IgE: <0.1 kU/L

## 2023-05-12 LAB — ALLERGEN, TOMATO F25: Allergen Tomato, IgE: <0.1 kU/L

## 2023-05-12 LAB — PE AND FLC, SERUM
A/G Ratio: 1 (ref 0.7–1.7)
Gamma Globulin: 1.1 g/dL (ref 0.4–1.8)
Ig Kappa Free Light Chain: 25 mg/L — ABNORMAL HIGH (ref 3.3–19.4)
KAPPA/LAMBDA RATIO: 1.3 (ref 0.26–1.65)
Total Protein: 7.6 g/dL (ref 6.0–8.5)

## 2023-05-12 LAB — IGG, IGA, IGM
IgA/Immunoglobulin A, Serum: 566 mg/dL — ABNORMAL HIGH (ref 87–352)
IgG (Immunoglobin G), Serum: 1202 mg/dL (ref 586–1602)

## 2023-05-12 LAB — ALLERGEN AVOCADO F96: F096-IgE Avocado: <0.1 kU/L

## 2023-05-13 LAB — UPEP/UIFE/LIGHT CHAINS/TP, 24-HR UR: Protein, 24H Urine: 173 mg/24 hr — ABNORMAL HIGH (ref 30–150)

## 2023-05-14 LAB — UPEP/UIFE/LIGHT CHAINS/TP, 24-HR UR
% BETA, Urine: 28.2 %
ALBUMIN, U: 28.2 %
ALPHA 1 URINE: 6.4 %
ALPHA-2-GLOBULIN, U: 10.6 %
Free Kappa Lt Chains,Ur: 4.4 mg/L (ref 1.17–86.46)
Free Lambda Lt Chains,Ur: 0.76 mg/L (ref 0.27–15.21)
Kappa/Lambda Ratio,U: 5.79 (ref 1.83–14.26)
Protein, Ur: 15 mg/dL

## 2023-05-28 ENCOUNTER — Other Ambulatory Visit: Payer: Self-pay | Admitting: Gastroenterology

## 2023-05-28 DIAGNOSIS — R140 Abdominal distension (gaseous): Secondary | ICD-10-CM

## 2023-05-29 ENCOUNTER — Telehealth: Payer: Self-pay | Admitting: Gastroenterology

## 2023-05-29 DIAGNOSIS — R140 Abdominal distension (gaseous): Secondary | ICD-10-CM

## 2023-05-29 MED ORDER — PANTOPRAZOLE SODIUM 40 MG PO TBEC
40.0000 mg | DELAYED_RELEASE_TABLET | Freq: Two times a day (BID) | ORAL | 2 refills | Status: DC
Start: 2023-05-29 — End: 2023-10-14

## 2023-05-29 NOTE — Telephone Encounter (Signed)
Patient is needing a refill for Pantoprazole.Marland KitchenPlease sent it to  [facility] at [address], [place], [place] [address].Thanks

## 2023-05-29 NOTE — Telephone Encounter (Signed)
Refill for Pantoprazole sent to pharmacy.

## 2023-07-07 ENCOUNTER — Encounter (HOSPITAL_COMMUNITY): Payer: Self-pay | Admitting: Gastroenterology

## 2023-07-07 NOTE — Progress Notes (Signed)
Attempted to obtain medical history via telephone, unable to reach at this time. HIPAA compliant voicemail message left requesting return call to pre surgical testing department. 

## 2023-07-15 NOTE — Anesthesia Preprocedure Evaluation (Addendum)
Anesthesia Evaluation  Patient identified by MRN, date of birth, ID band Patient awake    Reviewed: Allergy & Precautions, NPO status , Patient's Chart, lab work & pertinent test results  Airway Mallampati: II  TM Distance: >3 FB Neck ROM: Full    Dental no notable dental hx.    Pulmonary neg pulmonary ROS   Pulmonary exam normal        Cardiovascular negative cardio ROS  Rhythm:Regular Rate:Normal     Neuro/Psych negative neurological ROS  negative psych ROS   GI/Hepatic Neg liver ROS,GERD  Medicated,,Gastric nodule   Endo/Other  negative endocrine ROS    Renal/GU negative Renal ROS  negative genitourinary   Musculoskeletal negative musculoskeletal ROS (+)    Abdominal Normal abdominal exam  (+)   Peds  Hematology Lab Results      Component                Value               Date                      WBC                      8.7                 05/07/2023                HGB                      13.4                05/07/2023                HCT                      40.2                05/07/2023                MCV                      88                  05/07/2023                PLT                      365.0               12/30/2022             Lab Results      Component                Value               Date                      NA                       141                 05/07/2023                K  4.1                 05/07/2023                CO2                      23                  05/07/2023                GLUCOSE                  89                  05/07/2023                BUN                      10                  05/07/2023                CREATININE               0.60                05/07/2023                CALCIUM                  10.1                05/07/2023                GFR                      78.65               12/30/2022                EGFR                      106                 05/07/2023                GFRNONAA                 91                  03/16/2020              Anesthesia Other Findings   Reproductive/Obstetrics                             Anesthesia Physical Anesthesia Plan  ASA: 2  Anesthesia Plan: MAC   Post-op Pain Management:    Induction: Intravenous  PONV Risk Score and Plan: 2 and Propofol infusion and Treatment may vary due to age or medical condition  Airway Management Planned: Simple Face Mask and Nasal Cannula  Additional Equipment: None  Intra-op Plan:   Post-operative Plan:   Informed Consent: I have reviewed the patients History and Physical, chart, labs and discussed the procedure including the risks, benefits and alternatives for the proposed anesthesia with the patient or authorized representative who has indicated his/her understanding and acceptance.     Dental advisory given  Plan Discussed with:  Anesthesia Plan Comments:        Anesthesia Quick Evaluation

## 2023-07-16 ENCOUNTER — Other Ambulatory Visit: Payer: Self-pay

## 2023-07-16 ENCOUNTER — Ambulatory Visit (HOSPITAL_COMMUNITY): Payer: BC Managed Care – PPO | Admitting: Anesthesiology

## 2023-07-16 ENCOUNTER — Encounter (HOSPITAL_COMMUNITY): Payer: Self-pay | Admitting: Gastroenterology

## 2023-07-16 ENCOUNTER — Ambulatory Visit (HOSPITAL_COMMUNITY)
Admission: RE | Admit: 2023-07-16 | Discharge: 2023-07-16 | Disposition: A | Discharge status: Home / Self Care | Payer: BC Managed Care – PPO | Attending: Gastroenterology | Admitting: Gastroenterology

## 2023-07-16 ENCOUNTER — Telehealth: Payer: Self-pay

## 2023-07-16 ENCOUNTER — Encounter (HOSPITAL_COMMUNITY)
Admission: RE | Disposition: A | Discharge status: Home / Self Care | Payer: Self-pay | Source: Home / Self Care | Attending: Gastroenterology

## 2023-07-16 DIAGNOSIS — R748 Abnormal levels of other serum enzymes: Secondary | ICD-10-CM

## 2023-07-16 DIAGNOSIS — K219 Gastro-esophageal reflux disease without esophagitis: Secondary | ICD-10-CM | POA: Insufficient documentation

## 2023-07-16 DIAGNOSIS — K3189 Other diseases of stomach and duodenum: Secondary | ICD-10-CM

## 2023-07-16 DIAGNOSIS — K319 Disease of stomach and duodenum, unspecified: Secondary | ICD-10-CM

## 2023-07-16 DIAGNOSIS — K2100 Gastro-esophageal reflux disease with esophagitis, without bleeding: Secondary | ICD-10-CM

## 2023-07-16 DIAGNOSIS — I899 Noninfective disorder of lymphatic vessels and lymph nodes, unspecified: Secondary | ICD-10-CM

## 2023-07-16 HISTORY — PX: ESOPHAGOGASTRODUODENOSCOPY (EGD) WITH PROPOFOL: SHX5813

## 2023-07-16 HISTORY — PX: UPPER ESOPHAGEAL ENDOSCOPIC ULTRASOUND (EUS): SHX6562

## 2023-07-16 SURGERY — UPPER ESOPHAGEAL ENDOSCOPIC ULTRASOUND (EUS)
Anesthesia: Monitor Anesthesia Care

## 2023-07-16 MED ORDER — ONDANSETRON HCL 4 MG/2ML IJ SOLN
INTRAMUSCULAR | Status: DC | PRN
  Administered 2023-07-16: 4 mg via INTRAVENOUS

## 2023-07-16 MED ORDER — PROPOFOL 500 MG/50ML IV EMUL
INTRAVENOUS | Status: AC
  Filled 2023-07-16: qty 100

## 2023-07-16 MED ORDER — PROPOFOL 500 MG/50ML IV EMUL
INTRAVENOUS | Status: DC | PRN
  Administered 2023-07-16 (×2): 50 mg via INTRAVENOUS
  Administered 2023-07-16: 70 mg via INTRAVENOUS

## 2023-07-16 MED ORDER — LIDOCAINE HCL (PF) 2 % IJ SOLN
INTRAMUSCULAR | Status: DC | PRN
  Administered 2023-07-16: 100 mg via INTRADERMAL

## 2023-07-16 MED ORDER — DEXAMETHASONE SODIUM PHOSPHATE 10 MG/ML IJ SOLN
INTRAMUSCULAR | Status: DC | PRN
Start: 2023-07-16 — End: 2023-07-16
  Administered 2023-07-16: 5 mg via INTRAVENOUS

## 2023-07-16 MED ORDER — PROPOFOL 10 MG/ML IV BOLUS
INTRAVENOUS | Status: DC | PRN
  Administered 2023-07-16: 90 ug/kg/min via INTRAVENOUS

## 2023-07-16 MED ORDER — SODIUM CHLORIDE 0.9 % IV SOLN: INTRAVENOUS | Status: DC

## 2023-07-16 MED ORDER — MIDAZOLAM HCL 5 MG/5ML IJ SOLN
INTRAMUSCULAR | Status: DC | PRN
  Administered 2023-07-16: 2 mg via INTRAVENOUS

## 2023-07-16 MED ORDER — MIDAZOLAM HCL 2 MG/2ML IJ SOLN
INTRAMUSCULAR | Status: AC
  Filled 2023-07-16: qty 2

## 2023-07-16 MED ORDER — LACTATED RINGERS IV SOLN: INTRAVENOUS | Status: DC

## 2023-07-16 NOTE — Anesthesia Postprocedure Evaluation (Signed)
Anesthesia Post Note  Patient: Heather Whitney  Procedure(s) Performed: UPPER ESOPHAGEAL ENDOSCOPIC ULTRASOUND (EUS) ESOPHAGOGASTRODUODENOSCOPY (EGD) WITH PROPOFOL     Patient location during evaluation: PACU Anesthesia Type: MAC Level of consciousness: awake and alert Pain management: pain level controlled Vital Signs Assessment: post-procedure vital signs reviewed and stable Respiratory status: spontaneous breathing, nonlabored ventilation, respiratory function stable and patient connected to nasal cannula oxygen Cardiovascular status: stable and blood pressure returned to baseline Postop Assessment: no apparent nausea or vomiting Anesthetic complications: no   No notable events documented.  Last Vitals:  Vitals:   07/16/23 0910 07/16/23 0920  BP: (!) 123/54 102/87  Pulse: 70 70  Resp: 16 18  Temp:    SpO2: 94% 94%    Last Pain:  Vitals:   07/16/23 0920  TempSrc:   PainSc: 0-No pain                 Nelle Don Rebel [place]ridge

## 2023-07-16 NOTE — Telephone Encounter (Signed)
-----   Message from [facility] sent at 07/16/2023  9:02 AM EDT ----- Regarding: Follow-up Heather Whitney, This patient needs repeat HFP/INR to be drawn in 3 months with follow-up in clinic with APP or myself. Thanks. GM

## 2023-07-16 NOTE — Telephone Encounter (Signed)
Lab order has been entered and appt made for follow up on 11-6 at 1050 am.  Letter mailed to the pt

## 2023-07-16 NOTE — Discharge Instructions (Signed)

## 2023-07-16 NOTE — Transfer of Care (Signed)
Immediate Anesthesia Transfer of Care Note  Patient: Michea Crees  Procedure(s) Performed: UPPER ESOPHAGEAL ENDOSCOPIC ULTRASOUND (EUS) ESOPHAGOGASTRODUODENOSCOPY (EGD) WITH PROPOFOL  Patient Location: PACU and Endoscopy Unit  Anesthesia Type:MAC  Level of Consciousness: awake, alert , oriented, and patient cooperative  Airway & Oxygen Therapy: Patient Spontanous Breathing and Patient connected to face mask oxygen  Post-op Assessment: Report given to RN, Post -op Vital signs reviewed and stable, and Patient moving all extremities  Post vital signs: Reviewed and stable  Last Vitals:  Vitals Value Taken Time  BP 131/61 07/16/23 0900  Temp    Pulse 76 07/16/23 0900  Resp 24 07/16/23 0900  SpO2 97 % 07/16/23 0900  Vitals shown include unfiled device data.  Last Pain:  Vitals:   07/16/23 0706  TempSrc: Temporal  PainSc: 4       Patients Stated Pain Goal: 0 (07/16/23 0706)  Complications: No notable events documented.

## 2023-07-16 NOTE — Op Note (Signed)
[facility] Patient Name: Heather Whitney Procedure Date: 07/16/2023 MRN: [id number] Attending MD: Corliss Parish , MD, [id number] Date of Birth: 1966/02/11 CSN: [id number] Age: 57 Admit Type: Outpatient Procedure:                Upper EUS Indications:              Gastric deformity on endoscopy/Subepithelial tumor                            versus extrinsic compression, Elevated alkaline                            phosphatase Providers:                Corliss Parish, MD, Fransisca Connors, Kandice Robinsons, Technician Referring MD:             Pincus Sanes, MD Medicines:                Monitored Anesthesia Care Complications:            No immediate complications. Estimated Blood Loss:     Estimated blood loss was minimal. Procedure:                Pre-Anesthesia Assessment:                           - Prior to the procedure, a History and Physical                            was performed, and patient medications and                            allergies were reviewed. The patient's tolerance of                            previous anesthesia was also reviewed. The risks                            and benefits of the procedure and the sedation                            options and risks were discussed with the patient.                            All questions were answered, and informed consent                            was obtained. Prior Anticoagulants: The patient has                            taken no anticoagulant or antiplatelet agents. ASA                            Grade Assessment: II -  A patient with mild systemic                            disease. After reviewing the risks and benefits,                            the patient was deemed in satisfactory condition to                            undergo the procedure.                           After obtaining informed consent, the endoscope was                             passed under direct vision. Throughout the                            procedure, the patient's blood pressure, pulse, and                            oxygen saturations were monitored continuously. The                            [id number]-H190 ([id number]) Olympus endoscope was introduced                            through the mouth, and advanced to the second part                            of duodenum. The [id number]-[id number] ([id number]) Olympus                            duodenoscope was introduced through the mouth, and                            advanced to the area of papilla. The GF-[id number]                            ([id number]) Olympus linear ultrasound scope was                            introduced through the mouth, and advanced to the                            duodenum for ultrasound examination from the                            stomach and duodenum. The GF-UE190-AL5 ([id number])                            Olympus radial ultrasound scope was introduced  through the mouth, and advanced to the stomach for                            ultrasound examination. The upper EUS was                            accomplished without difficulty. The patient                            tolerated the procedure. Scope In: Scope Out: Findings:      ENDOSCOPIC FINDING: :      No gross lesions were noted in the entire esophagus.      The Z-line was regular and was found 35 cm from the incisors.      Extrinsic compression on the stomach was found on the greater curvature       of the gastric antrum.      No other gross lesions were noted in the entire examined stomach.      No gross lesions were noted in the duodenal bulb, in the first portion       of the duodenum and in the second portion of the duodenum.      The major papilla was normal and hidden under a hood.      ENDOSONOGRAPHIC FINDING: :      No malignant-appearing lymph nodes were visualized in the celiac region       (level 20),  perigastric region, peripancreatic region and porta hepatis       region.      The celiac region was visualized.      There was extrinsic impression/compression in the antrum of the stomach.       Endosonographic examination showed this compression to be due to a       normal-appearing liver.      Endosonographic imaging of the ampulla showed no intramural       (subepithelial) lesion.      There was no sign of significant endosonographic abnormality in the       common bile duct and in the common hepatic duct. Ducts with regular       contour were identified. No evidence of choledocholithiasis was found.      There was no sign of significant endosonographic abnormality in the       pancreatic head, genu of the pancreas, pancreatic body and pancreatic       tail. No masses, no cysts, the pancreatic duct was regular in contour.      Endosonographic imaging in the visualized portion of the liver showed no       mass. Impression:               EGD impression:                           - No gross lesions in the entire esophagus. Z-line                            regular, 35 cm from the incisors.                           - Extrinsic impression/compression versus gastric  SEL in the gastric antrum (greater curvature).                           - No other gross lesions in the entire stomach.                           - No gross lesions in the duodenal bulb, in the                            first portion of the duodenum and in the second                            portion of the duodenum.                           - Normal major papilla (hidden under a hood).                           EUS impression:                           - Extrinsic impression was noted in the antrum of                            the stomach due to a normal-appearing liver. This                            is the area noted above on endoscopy                           - There was no sign of  significant pathology in the                            pancreatic head, genu of the pancreas, pancreatic                            body and pancreatic tail.                           - There was no sign of significant pathology in the                            common bile duct and in the common hepatic duct.                           - No malignant-appearing lymph nodes were                            visualized in the celiac region (level 20),                            perigastric region, peripancreatic region and porta  hepatis region. Moderate Sedation:      Not Applicable - Patient had care per Anesthesia. Recommendation:           - The patient will be observed post-procedure,                            until all discharge criteria are met.                           - Discharge patient to home.                           - Patient has a contact number available for                            emergencies. The signs and symptoms of potential                            delayed complications were discussed with the                            patient. Return to normal activities tomorrow.                            Written discharge instructions were provided to the                            patient.                           - Resume previous diet.                           - Observe patient's clinical course.                           - Check liver enzymes (AST, ALT, alkaline                            phosphatase, bilirubin).                           - No additional follow-up is required of the                            extrinsic impression.                           - If elevation in alkaline phosphatase remains,                            consider timing of possible liver biopsy.                           - The findings and recommendations were discussed  with the patient.                           - The findings and  recommendations were discussed                            with the patient's family. Procedure Code(s):        --- Professional ---                           [phone], Esophagogastroduodenoscopy, flexible,                            transoral; with endoscopic ultrasound examination                            limited to the esophagus, stomach or duodenum, and                            adjacent structures Diagnosis Code(s):        --- Professional ---                           K31.89, Other diseases of stomach and duodenum                           I89.9, Noninfective disorder of lymphatic vessels                            and lymph nodes, unspecified                           R74.8, Abnormal levels of other serum enzymes CPT copyright 2022 [employer]. All rights reserved. The codes documented in this report are preliminary and upon coder review may  be revised to meet current compliance requirements. Corliss Parish, MD 07/16/2023 9:02:13 AM Number of Addenda: 0

## 2023-07-16 NOTE — H&P (Signed)
GASTROENTEROLOGY PROCEDURE H&P NOTE   Primary Care Physician: Pincus Sanes, MD  HPI: Heather Whitney is a 57 y.o. female who presents for EGD/EUS to evaluate a gastric nodule/SEL and elevated AP.  Past Medical History:  Diagnosis Date   Colon polyps    Elevated alkaline phosphatase level 03/14/2020   Elevated ALT measurement 03/14/2020   Past Surgical History:  Procedure Laterality Date   WISDOM TOOTH EXTRACTION     Current Facility-Administered Medications  Medication Dose Route Frequency Provider Last Rate Last Admin   0.9 %  sodium chloride infusion   Intravenous Continuous Mansouraty, Netty Starring., MD       lactated ringers infusion   Intravenous Continuous Mansouraty, Netty Starring., MD 10 mL/hr at 07/16/23 0719 New Bag at 07/16/23 0719    Current Facility-Administered Medications:    0.9 %  sodium chloride infusion, , Intravenous, Continuous, Mansouraty, Netty Starring., MD   lactated ringers infusion, , Intravenous, Continuous, Mansouraty, Netty Starring., MD, Last Rate: 10 mL/hr at 07/16/23 0719, New Bag at 07/16/23 0719 No Known Allergies Family History  Problem Relation Age of Onset   Allergic rhinitis Mother    Colon polyps Mother    Atrial fibrillation Mother    Hypertension Father    Colon polyps Father    Allergic rhinitis Sister    Allergic rhinitis Sister    Allergic rhinitis Sister    Allergic rhinitis Sister    Allergic rhinitis Brother    Colon polyps Brother    Allergic rhinitis Brother    Asthma Brother    Hypertension Paternal Grandmother    Colon cancer Neg Hx    Esophageal cancer Neg Hx    Stomach cancer Neg Hx    Rectal cancer Neg Hx    Inflammatory bowel disease Neg Hx    Liver disease Neg Hx    Pancreatic cancer Neg Hx    Social History   Socioeconomic History   Marital status: Married    Spouse name: Not on file   Number of children: 0   Years of education: Not on file   Highest education level: Not on file  Occupational History   Not  on file  Tobacco Use   Smoking status: Never    Passive exposure: Never   Smokeless tobacco: Never  Vaping Use   Vaping status: Never Used  Substance and Sexual Activity   Alcohol use: Yes    Comment: occasional    Drug use: Never   Sexual activity: Not on file  Other Topics Concern   Not on file  Social History Narrative   Not on file   Social Determinants of Health   Financial Resource Strain: Not on file  Food Insecurity: Not on file  Transportation Needs: Not on file  Physical Activity: Not on file  Stress: Not on file  Social Connections: Unknown (04/25/2022)   Received from [facility]   Social Network    Social Network: Not on file  Intimate Partner Violence: Unknown (03/18/2022)   Received from [facility]   HITS    Physically Hurt: Not on file    Insult or Talk Down To: Not on file    Threaten Physical Harm: Not on file    Scream or Curse: Not on file    Physical Exam: Today's Vitals   07/07/23 1201 07/16/23 0706  BP:  (!) 158/87  Pulse:  76  Resp:  18  Temp:  98.3 F (36.8 C)  TempSrc:  Temporal  SpO2:  98%  Weight: 98.4 kg 98.4 kg  Height:  5\' 4"  (1.626 m)  PainSc:  4    Body mass index is 37.24 kg/m. GEN: NAD EYE: Sclerae anicteric ENT: MMM CV: Non-tachycardic GI: Soft, NT/ND NEURO:  Alert & Oriented x 3  Lab Results: No results for input(s): "WBC", "HGB", "HCT", "PLT" in the last 72 hours. BMET No results for input(s): "NA", "K", "CL", "CO2", "GLUCOSE", "BUN", "CREATININE", "CALCIUM" in the last 72 hours. LFT No results for input(s): "PROT", "ALBUMIN", "AST", "ALT", "ALKPHOS", "BILITOT", "BILIDIR", "IBILI" in the last 72 hours. PT/INR No results for input(s): "LABPROT", "INR" in the last 72 hours.   Impression / Plan: This is a 57 y.o.female who presents for EGD/EUS to evaluate a gastric nodule/SEL and elevated AP.  The risks of an EUS including intestinal perforation, bleeding, infection, aspiration, and medication effects were  discussed as was the possibility it may not give a definitive diagnosis if a biopsy is performed.  When a biopsy of the pancreas is done as part of the EUS, there is an additional risk of pancreatitis at the rate of about 1-2%.  It was explained that procedure related pancreatitis is typically mild, although it can be severe and even life threatening, which is why we do not perform random pancreatic biopsies and only biopsy a lesion/area we feel is concerning enough to warrant the risk.  The risks and benefits of endoscopic evaluation/treatment were discussed with the patient and/or family; these include but are not limited to the risk of perforation, infection, bleeding, missed lesions, lack of diagnosis, severe illness requiring hospitalization, as well as anesthesia and sedation related illnesses.  The patient's history has been reviewed, patient examined, no change in status, and deemed stable for procedure.  The patient and/or family is agreeable to proceed.    Corliss Parish, MD [facility]oscopy Office # [phone]

## 2023-07-17 ENCOUNTER — Encounter (HOSPITAL_COMMUNITY): Payer: Self-pay | Admitting: Gastroenterology

## 2023-08-01 DIAGNOSIS — B351 Tinea unguium: Secondary | ICD-10-CM | POA: Diagnosis not present

## 2023-09-04 ENCOUNTER — Ambulatory Visit: Payer: BC Managed Care – PPO | Admitting: Allergy

## 2023-10-13 ENCOUNTER — Other Ambulatory Visit: Payer: Self-pay | Admitting: Gastroenterology

## 2023-10-13 DIAGNOSIS — R140 Abdominal distension (gaseous): Secondary | ICD-10-CM

## 2023-10-20 ENCOUNTER — Other Ambulatory Visit (INDEPENDENT_AMBULATORY_CARE_PROVIDER_SITE_OTHER): Payer: BC Managed Care – PPO

## 2023-10-20 DIAGNOSIS — R748 Abnormal levels of other serum enzymes: Secondary | ICD-10-CM | POA: Diagnosis not present

## 2023-10-20 LAB — PROTIME-INR
INR: 1 {ratio} (ref 0.8–1.0)
Prothrombin Time: 10.4 s (ref 9.6–13.1)

## 2023-10-20 LAB — HEPATIC FUNCTION PANEL
ALT: 20 U/L (ref 0–35)
AST: 18 U/L (ref 0–37)
Albumin: 4.4 g/dL (ref 3.5–5.2)
Alkaline Phosphatase: 129 U/L — ABNORMAL HIGH (ref 39–117)
Bilirubin, Direct: 0.1 mg/dL (ref 0.0–0.3)
Total Bilirubin: 0.5 mg/dL (ref 0.2–1.2)
Total Protein: 8 g/dL (ref 6.0–8.3)

## 2023-10-22 ENCOUNTER — Encounter: Payer: Self-pay | Admitting: Gastroenterology

## 2023-10-22 ENCOUNTER — Ambulatory Visit (INDEPENDENT_AMBULATORY_CARE_PROVIDER_SITE_OTHER): Payer: BC Managed Care – PPO | Admitting: Gastroenterology

## 2023-10-22 VITALS — HR 68 | Ht 64.0 in | Wt 216.4 lb

## 2023-10-22 DIAGNOSIS — K2100 Gastro-esophageal reflux disease with esophagitis, without bleeding: Secondary | ICD-10-CM

## 2023-10-22 DIAGNOSIS — R140 Abdominal distension (gaseous): Secondary | ICD-10-CM | POA: Diagnosis not present

## 2023-10-22 DIAGNOSIS — R748 Abnormal levels of other serum enzymes: Secondary | ICD-10-CM

## 2023-10-22 MED ORDER — PANTOPRAZOLE SODIUM 40 MG PO TBEC
40.0000 mg | DELAYED_RELEASE_TABLET | Freq: Two times a day (BID) | ORAL | 4 refills | Status: AC
Start: 2023-10-22 — End: ?

## 2023-10-22 NOTE — Patient Instructions (Addendum)
We have sent the following medications to your pharmacy for you to pick up at your convenience: Protonix  Follow up in 4-5 months. Office to contact you to schedule.   You will need labs prior to your office follow-up in March. If you have not heard from our office by late Jan 2025, please contact us at [phone] to schedule your appointment.   _______________________________________________________  If your blood pressure at your visit was 140/90 or greater, please contact your primary care physician to follow up on this.  _______________________________________________________  If you are age 55 or older, your body mass index should be between 23-30. Your Body mass index is 37.14 kg/m. If this is out of the aforementioned range listed, please consider follow up with your Primary Care Provider.  If you are age 53 or younger, your body mass index should be between 19-25. Your Body mass index is 37.14 kg/m. If this is out of the aformentioned range listed, please consider follow up with your Primary Care Provider.   ________________________________________________________  The [facility] providers would like to encourage you to use [facility] to communicate with providers for non-urgent requests or questions.  Due to long hold times on the telephone, sending your provider a message by [facility] may be a faster and more efficient way to get a response.  Please allow 48 business hours for a response.  Please remember that this is for non-urgent requests.  _______________________________________________________  Thank you for choosing me and [facility].  Dr. Meridee Score

## 2023-10-22 NOTE — Progress Notes (Signed)
GASTROENTEROLOGY OUTPATIENT CLINIC VISIT   Primary Care Provider Pincus Sanes, MD [address] [place] [address] [phone]  Patient Profile: Heather Whitney is a 57 y.o. 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  The patient presents to the [facility] for an evaluation and management of problem(s) noted below:  Problem List 1. Gastroesophageal reflux disease with esophagitis without hemorrhage   2. Elevated alkaline phosphatase level   3. Abdominal bloating    Discussed the use of AI scribe software for clinical note transcription with the patient, who gave verbal consent to proceed.  History of Present Illness Please see prior notes for full details of HPI.  Interval History The patient returns for follow-up.  She reports significant improvement in heartburn symptoms with twice-daily Protonix.  She notes that cessation of the medication during a recent vacation led to a recurrence of symptoms, suggesting the necessity of the current regimen.  She would like to continue the PPI at current dosing if possible.  The patient also reports a slight weight loss, which she attributes to dietary modifications, including reduced fat and carbohydrate intake.  She denies any new symptoms such as jaundice, dark urine, or leg swelling.  She had recent laboratories performed that showed a persistently elevated alkaline phosphatase level but much lower than prior.   GI Review of Systems Positive as above Negative for dysphagia, pain, melena, hematochezia  Review of Systems General: Denies fevers/chills/weight loss unintentionally Cardiovascular: Denies chest pain Pulmonary: Denies shortness of breath Gastroenterological: See HPI Genitourinary: Denies darkened urine Hematological: Denies easy  bruising/bleeding Dermatological: Denies jaundice Psychological: Mood is stable   Medications Current Outpatient Medications  Medication Sig Dispense Refill   acetaminophen (TYLENOL) 500 MG tablet Take 1,000 mg by mouth every 6 (six) hours as needed for mild pain or headache.     albuterol (VENTOLIN HFA) 108 (90 Base) MCG/ACT inhaler Inhale 2 puffs into the lungs every 6 (six) hours as needed for wheezing or shortness of breath. 8 g 0   calcium carbonate (OSCAL) 1500 (600 Ca) MG TABS tablet Take 1,200 mg by mouth daily with breakfast.     cholecalciferol (VITAMIN D3) 25 MCG (1000 UNIT) tablet Take 2,000 Units by mouth daily.     fluticasone (FLONASE) 50 MCG/ACT nasal spray Place 2 sprays into both nostrils daily. 16 g 12   loratadine (CLARITIN) 10 MG tablet Take 10 mg by mouth daily.     milk thistle 175 MG tablet Take 175 mg by mouth 3 (three) times daily.     pantoprazole (PROTONIX) 40 MG tablet Take 1 tablet (40 mg total) by mouth 2 (two) times daily. 90 tablet 4   No current facility-administered medications for this visit.    Allergies No Known Allergies  Histories Past Medical History:  Diagnosis Date   Colon polyps    Elevated alkaline phosphatase level 03/14/2020   Elevated ALT measurement 03/14/2020   Past Surgical History:  Procedure Laterality Date   ESOPHAGOGASTRODUODENOSCOPY (EGD) WITH PROPOFOL N/A 07/16/2023   Procedure: ESOPHAGOGASTRODUODENOSCOPY (EGD) WITH PROPOFOL;  Surgeon: Lemar Lofty., MD;  Location: [facility];  Service: Gastroenterology;  Laterality: N/A;   UPPER ESOPHAGEAL ENDOSCOPIC ULTRASOUND (EUS) N/A 07/16/2023   Procedure: UPPER ESOPHAGEAL ENDOSCOPIC ULTRASOUND (EUS);  Surgeon: Lemar Lofty., MD;  Location: [facility];  Service: Gastroenterology;  Laterality: N/A;   WISDOM TOOTH EXTRACTION     Social  History   Socioeconomic History   Marital status: Married    Spouse name: Not on file   Number of children: 0   Years of  education: Not on file   Highest education level: Not on file  Occupational History   Not on file  Tobacco Use   Smoking status: Never    Passive exposure: Never   Smokeless tobacco: Never  Vaping Use   Vaping status: Never Used  Substance and Sexual Activity   Alcohol use: Yes    Comment: occasional    Drug use: Never   Sexual activity: Not on file  Other Topics Concern   Not on file  Social History Narrative   Not on file   Social Determinants of Health   Financial Resource Strain: Not on file  Food Insecurity: Not on file  Transportation Needs: Not on file  Physical Activity: Not on file  Stress: Not on file  Social Connections: Unknown (04/25/2022)   Received from [facility]   Social Network    Social Network: Not on file  Intimate Partner Violence: Unknown (03/18/2022)   Received from [employer], [facility]   HITS    Physically Hurt: Not on file    Insult or Talk Down To: Not on file    Threaten Physical Harm: Not on file    Scream or Curse: Not on file   Family History  Problem Relation Age of Onset   Allergic rhinitis Mother    Colon polyps Mother    Atrial fibrillation Mother    Hypertension Father    Colon polyps Father    Allergic rhinitis Sister    Allergic rhinitis Sister    Allergic rhinitis Sister    Allergic rhinitis Sister    Allergic rhinitis Brother    Colon polyps Brother    Allergic rhinitis Brother    Asthma Brother    Hypertension Paternal Grandmother    Colon cancer Neg Hx    Esophageal cancer Neg Hx    Stomach cancer Neg Hx    Rectal cancer Neg Hx    Inflammatory bowel disease Neg Hx    Liver disease Neg Hx    Pancreatic cancer Neg Hx    I have reviewed her medical, social, and family history in detail and updated the electronic medical record as necessary.    PHYSICAL EXAMINATION  Pulse 68   Ht 5\' 4"  (1.626 m)   Wt 216 lb 6 oz (98.1 kg)   LMP 11/16/2019   SpO2 96%   BMI 37.14 kg/m  Wt Readings from  Last 3 Encounters:  10/22/23 216 lb 6 oz (98.1 kg)  07/16/23 216 lb 14.9 oz (98.4 kg)  05/07/23 216 lb (98 kg)  GEN: NAD, appears stated age, doesn't appear chronically ill PSYCH: Cooperative, without pressured speech EYE: Conjunctivae pink, sclerae anicteric ENT: MMM CV: Nontachycardic RESP: No audible wheezing GI: NABS, soft, rounded, protuberant abdomen, NT, without rebound or guarding MSK/EXT: No significant lower extremity edema SKIN: No jaundice, no spider angiomata NEURO:  Alert & Oriented x 3, no focal deficits   REVIEW OF DATA  I reviewed the following data at the time of this encounter:  GI Procedures and Studies  July 2020 for upper EUS EGD impression: - No gross lesions in the entire esophagus. Z-line regular, 35 cm from the incisors. - Extrinsic impression/compression versus gastric SEL in the gastric antrum (greater curvature). - No other gross lesions in the entire stomach. - No gross lesions  in the duodenal bulb, in the first portion of the duodenum and in the second portion of the duodenum. - Normal major papilla (hidden under a hood).  EUS impression: - Extrinsic impression was noted in the antrum of the stomach due to a normal-appearing liver. This is the area noted above on endoscopy - There was no sign of significant pathology in the pancreatic head, genu of the pancreas, pancreatic body and pancreatic tail. - There was no sign of significant pathology in the common bile duct and in the common hepatic duct. - No malignant-appearing lymph nodes were visualized in the celiac region (level 20), perigastric region, peripancreatic region and porta hepatis region.  Laboratory Studies  Reviewed those in epic  Imaging Studies  No new imaging studies to review   ASSESSMENT  Ms. Kopinski is a 57 y.o. 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  The  patient is seen today for evaluation and management of:  1. Gastroesophageal reflux disease with esophagitis without hemorrhage   2. Elevated alkaline phosphatase level   3. Abdominal bloating    The patient is hemodynamically and clinically stable.  PPI therapy has been helpful for her and although we want her to be on the lowest effective dose her current dose seems to be helpful at twice daily dosing.  She will see if she can reduce it to once daily.  Most recent alkaline phosphatase levels have decreased.  She is working on further weight reduction/loss.  We discussed the role of liver biopsy but as the alkaline phosphatase is decreased, we have decided to continue to monitor.  If issues persist in regards to alkaline phosphatase elevation will consider liver biopsy.  All patient questions were answered to the best of my ability, and the patient agrees to the aforementioned plan of action with follow-up as indicated.   PLAN  Laboratories as outlined below to be drawn in February/March If persisting alkaline phosphatase levels then we will consider liver biopsy Continue PPI 40 mg twice daily - If patient feels she can down titrate to once daily she will try and update [place]   Orders Placed This Encounter  Procedures   Comp Met (CMET)   Alkaline Phosphatase Isoenzymes    New Prescriptions   No medications on file   Modified Medications   Modified Medication Previous Medication   PANTOPRAZOLE (PROTONIX) 40 MG TABLET pantoprazole (PROTONIX) 40 MG tablet      Take 1 tablet (40 mg total) by mouth 2 (two) times daily.    TAKE 1 TABLET(40 MG) BY MOUTH TWICE DAILY    Planned Follow Up Return in about 4 months (around 02/19/2024).   Total Time in Face-to-Face and in Coordination of Care for patient including independent/personal interpretation/review of prior testing, medical history, examination, medication adjustment, communicating results with the patient directly, and documentation within  the EHR is 25 minutes.   Corliss Parish, MD [facility] Office # [phone]

## 2023-10-25 ENCOUNTER — Encounter: Payer: Self-pay | Admitting: Gastroenterology

## 2023-10-25 DIAGNOSIS — R140 Abdominal distension (gaseous): Secondary | ICD-10-CM | POA: Insufficient documentation

## 2024-01-12 ENCOUNTER — Encounter: Payer: Self-pay | Admitting: Gastroenterology

## 2024-06-08 DIAGNOSIS — E785 Hyperlipidemia, unspecified: Secondary | ICD-10-CM | POA: Insufficient documentation

## 2024-06-08 NOTE — Patient Instructions (Addendum)
 Blood work was ordered.       Medications changes include :   Trazodone 50 mg at bedtime     Call and schedule your mammogram - [facility] --  Schedule an appointment by calling [phone]    Return in about 1 year (around 06/09/2025) for Physical Exam.   Health Maintenance, Female Adopting a healthy lifestyle and getting preventive care are important in promoting health and wellness. Ask your health care provider about: The right schedule for you to have regular tests and exams. Things you can do on your own to prevent diseases and keep yourself healthy. What should I know about diet, weight, and exercise? Eat a healthy diet  Eat a diet that includes plenty of vegetables, fruits, low-fat dairy products, and lean protein. Do not eat a lot of foods that are high in solid fats, added sugars, or sodium. Maintain a healthy weight Body mass index (BMI) is used to identify weight problems. It estimates body fat based on height and weight. Your health care provider can help determine your BMI and help you achieve or maintain a healthy weight. Get regular exercise Get regular exercise. This is one of the most important things you can do for your health. Most adults should: Exercise for at least 150 minutes each week. The exercise should increase your heart rate and make you sweat (moderate-intensity exercise). Do strengthening exercises at least twice a week. This is in addition to the moderate-intensity exercise. Spend less time sitting. Even light physical activity can be beneficial. Watch cholesterol and blood lipids Have your blood tested for lipids and cholesterol at 56 years of age, then have this test every 5 years. Have your cholesterol levels checked more often if: Your lipid or cholesterol levels are high. You are older than 56 years of age. You are at high risk for heart disease. What should I know about cancer screening? Depending on  your health history and family history, you may need to have cancer screening at various ages. This may include screening for: Breast cancer. Cervical cancer. Colorectal cancer. Skin cancer. Lung cancer. What should I know about heart disease, diabetes, and high blood pressure? Blood pressure and heart disease High blood pressure causes heart disease and increases the risk of stroke. This is more likely to develop in people who have high blood pressure readings or are overweight. Have your blood pressure checked: Every 3-5 years if you are 50-36 years of age. Every year if you are 69 years old or older. Diabetes Have regular diabetes screenings. This checks your fasting blood sugar level. Have the screening done: Once every three years after age 18 if you are at a normal weight and have a low risk for diabetes. More often and at a younger age if you are overweight or have a high risk for diabetes. What should I know about preventing infection? Hepatitis B If you have a higher risk for hepatitis B, you should be screened for this virus. Talk with your health care provider to find out if you are at risk for hepatitis B infection. Hepatitis C Testing is recommended for: Everyone born from [birth year] through 1963. Anyone with known risk factors for hepatitis C. Sexually transmitted infections (STIs) Get screened for STIs, including gonorrhea and chlamydia, if: You are sexually active and are younger than 56 years of age. You are older than 56 years of age and your health care provider tells you that you  are at risk for this type of infection. Your sexual activity has changed since you were last screened, and you are at increased risk for chlamydia or gonorrhea. Ask your health care provider if you are at risk. Ask your health care provider about whether you are at high risk for HIV. Your health care provider may recommend a prescription medicine to help prevent HIV infection. If you choose to take  medicine to prevent HIV, you should first get tested for HIV. You should then be tested every 3 months for as long as you are taking the medicine. Pregnancy If you are about to stop having your period (premenopausal) and you may become pregnant, seek counseling before you get pregnant. Take 400 to 800 micrograms (mcg) of folic acid every day if you become pregnant. Ask for birth control (contraception) if you want to prevent pregnancy. Osteoporosis and menopause Osteoporosis is a disease in which the bones lose minerals and strength with aging. This can result in bone fractures. If you are 5 years old or older, or if you are at risk for osteoporosis and fractures, ask your health care provider if you should: Be screened for bone loss. Take a calcium or vitamin D supplement to lower your risk of fractures. Be given hormone replacement therapy (HRT) to treat symptoms of menopause. Follow these instructions at home: Alcohol use Do not drink alcohol if: Your health care provider tells you not to drink. You are pregnant, may be pregnant, or are planning to become pregnant. If you drink alcohol: Limit how much you have to: 0-1 drink a day. Know how much alcohol is in your drink. In the [place] one drink equals one 12 oz bottle of beer (355 mL), one 5 oz glass of wine (148 mL), or one 1 oz glass of hard liquor (44 mL). Lifestyle Do not use any products that contain nicotine or tobacco. These products include cigarettes, chewing tobacco, and vaping devices, such as e-cigarettes. If you need help quitting, ask your health care provider. Do not use street drugs. Do not share needles. Ask your health care provider for help if you need support or information about quitting drugs. General instructions Schedule regular health, dental, and eye exams. Stay current with your vaccines. Tell your health care provider if: You often feel depressed. You have ever been abused or do not feel safe at  home. Summary Adopting a healthy lifestyle and getting preventive care are important in promoting health and wellness. Follow your health care provider's instructions about healthy diet, exercising, and getting tested or screened for diseases. Follow your health care provider's instructions on monitoring your cholesterol and blood pressure. This information is not intended to replace advice given to you by your health care provider. Make sure you discuss any questions you have with your health care provider. Document Revised: 04/23/2021 Document Reviewed: 04/23/2021 Elsevier Patient Education  2024 [employer].

## 2024-06-08 NOTE — Progress Notes (Unsigned)
 Subjective:    Patient ID: Heather Whitney, female    DOB: 1966/09/11, 58 y.o.   MRN: [id number]      HPI Heather Whitney is here for a Physical exam and her chronic medical problems.    Caring for elderly parents and in-laws.  There has been a lot of increased rest over the past few months and she has not been doing everything she needs to do for self.  Has more irritability and mood swings and fogginess, decreased concentration.  She feels it is likely all related to menopause.  Last menses 2021.     Medications and allergies reviewed with patient and updated if appropriate.  Current Outpatient Medications on File Prior to Visit  Medication Sig Dispense Refill   acetaminophen (TYLENOL) 500 MG tablet Take 1,000 mg by mouth every 6 (six) hours as needed for mild pain or headache.     albuterol  (VENTOLIN  HFA) 108 (90 Base) MCG/ACT inhaler Inhale 2 puffs into the lungs every 6 (six) hours as needed for wheezing or shortness of breath. 8 g 0   calcium carbonate (OSCAL) 1500 (600 Ca) MG TABS tablet Take 1,200 mg by mouth daily with breakfast.     cholecalciferol (VITAMIN D3) 25 MCG (1000 UNIT) tablet Take 2,000 Units by mouth daily.     fluticasone  (FLONASE ) 50 MCG/ACT nasal spray Place 2 sprays into both nostrils daily. 16 g 12   loratadine (CLARITIN) 10 MG tablet Take 10 mg by mouth daily.     milk thistle 175 MG tablet Take 175 mg by mouth 3 (three) times daily.     pantoprazole  (PROTONIX ) 40 MG tablet Take 1 tablet (40 mg total) by mouth 2 (two) times daily. 90 tablet 4   No current facility-administered medications on file prior to visit.    Review of Systems  Constitutional:  Negative for fever.       Get hot all of a sudden - ? hotflash  Eyes:  Negative for visual disturbance.  Respiratory:  Negative for cough, shortness of breath and wheezing.   Cardiovascular:  Positive for chest pain (occ - transient wtih stress) and palpitations (occ - transient wtih stress). Negative for leg  swelling.  Gastrointestinal:  Negative for abdominal pain, blood in stool, constipation and diarrhea.       No gerd - controlled with medication  Genitourinary:  Negative for dysuria.  Musculoskeletal:  Positive for back pain (stiffness from not moving enough) and neck stiffness (from sleeping certain ways). Negative for arthralgias (knees).  Skin:  Positive for rash (itchy dry patch on right wrist).  Neurological:  Positive for headaches (occ - sinus). Negative for light-headedness.  Psychiatric/Behavioral:  Negative for dysphoric mood. The patient is not nervous/anxious.        Objective:   Vitals:   06/09/24 1304  BP: 124/68  Pulse: 69  Temp: 98 F (36.7 C)  SpO2: 93%   Filed Weights   06/09/24 1304  Weight: 227 lb (103 kg)   Body mass index is 38.96 kg/m.  BP Readings from Last 3 Encounters:  06/09/24 124/68  07/16/23 102/87  05/07/23 120/76    Wt Readings from Last 3 Encounters:  06/09/24 227 lb (103 kg)  10/22/23 216 lb 6 oz (98.1 kg)  07/16/23 216 lb 14.9 oz (98.4 kg)       Physical Exam Constitutional: She appears well-developed and well-nourished. No distress.  HENT:  Head: Normocephalic and atraumatic.  Right Ear: External ear normal. Normal ear canal and  TM Left Ear: External ear normal.  Normal ear canal and TM Mouth/Throat: Oropharynx is clear and moist.  Eyes: Conjunctivae normal.  Neck: Neck supple. No tracheal deviation present. No thyromegaly present.  No carotid bruit  Cardiovascular: Normal rate, regular rhythm and normal heart sounds.   No murmur heard.  No edema. Pulmonary/Chest: Effort normal and breath sounds normal. No respiratory distress. She has no wheezes. She has no rales.  Breast: deferred   Abdominal: Soft. She exhibits no distension. There is no tenderness.  Lymphadenopathy: She has no cervical adenopathy.  Skin: Skin is warm and dry. She is not diaphoretic.  Psychiatric: She has a normal mood and affect. Her behavior is  normal.     Lab Results  Component Value Date   WBC 8.7 05/07/2023   HGB 13.4 05/07/2023   HCT 40.2 05/07/2023   PLT 365.0 12/30/2022   GLUCOSE 89 05/07/2023   CHOL 232 (H) 12/30/2022   TRIG 105 12/30/2022   HDL 47 04/26/2020   LDLCALC 119 (H) 04/26/2020   ALT 20 10/20/2023   AST 18 10/20/2023   NA 141 05/07/2023   K 4.1 05/07/2023   CL 103 05/07/2023   CREATININE 0.60 05/07/2023   BUN 10 05/07/2023   CO2 23 05/07/2023   TSH 4.09 12/30/2022   INR 1.0 10/20/2023         Assessment & Plan:   Physical exam: Screening blood work  ordered Exercise  stretching --- will try start exercising-stressed the importance of regular exercise Weight  obese - encouraged weight loss Substance abuse  none   Reviewed recommended immunizations.   Health Maintenance  Topic Date Due   DEXA SCAN  Never done   HIV Screening  Never done   DTaP/Tdap/Td (1 - Tdap) Never done   Cervical Cancer Screening (HPV/Pap Cotest)  Never done   Zoster Vaccines- Shingrix (1 of 2) Never done   COVID-19 Vaccine (3 - 2024-25 season) 06/24/2024 (Originally 08/17/2023)   INFLUENZA VACCINE  07/16/2024   Colonoscopy  01/25/2028   Hepatitis B Vaccines  Completed   Hepatitis C Screening  Completed   HPV VACCINES  Aged Out   Meningococcal B Vaccine  Aged Out   MAMMOGRAM  Discontinued          See Problem List for Assessment and Plan of chronic medical problems.

## 2024-06-09 ENCOUNTER — Encounter: Payer: Self-pay | Admitting: Internal Medicine

## 2024-06-09 ENCOUNTER — Ambulatory Visit: Admitting: Internal Medicine

## 2024-06-09 VITALS — BP 124/68 | HR 69 | Temp 98.0°F | Ht 64.0 in | Wt 227.0 lb

## 2024-06-09 DIAGNOSIS — E559 Vitamin D deficiency, unspecified: Secondary | ICD-10-CM | POA: Diagnosis not present

## 2024-06-09 DIAGNOSIS — E785 Hyperlipidemia, unspecified: Secondary | ICD-10-CM

## 2024-06-09 DIAGNOSIS — Z1231 Encounter for screening mammogram for malignant neoplasm of breast: Secondary | ICD-10-CM

## 2024-06-09 DIAGNOSIS — E669 Obesity, unspecified: Secondary | ICD-10-CM | POA: Diagnosis not present

## 2024-06-09 DIAGNOSIS — K2100 Gastro-esophageal reflux disease with esophagitis, without bleeding: Secondary | ICD-10-CM

## 2024-06-09 DIAGNOSIS — G479 Sleep disorder, unspecified: Secondary | ICD-10-CM

## 2024-06-09 DIAGNOSIS — R21 Rash and other nonspecific skin eruption: Secondary | ICD-10-CM

## 2024-06-09 DIAGNOSIS — Z0000 Encounter for general adult medical examination without abnormal findings: Secondary | ICD-10-CM | POA: Diagnosis not present

## 2024-06-09 LAB — COMPREHENSIVE METABOLIC PANEL WITH GFR
ALT: 32 U/L (ref 0–35)
AST: 24 U/L (ref 0–37)
Albumin: 4.6 g/dL (ref 3.5–5.2)
Alkaline Phosphatase: 131 U/L — ABNORMAL HIGH (ref 39–117)
BUN: 13 mg/dL (ref 6–23)
CO2: 30 meq/L (ref 19–32)
Calcium: 10 mg/dL (ref 8.4–10.5)
Chloride: 101 meq/L (ref 96–112)
Creatinine, Ser: 0.7 mg/dL (ref 0.40–1.20)
GFR: 96.89 mL/min (ref >60.00)
Glucose, Bld: 97 mg/dL (ref 70–99)
Potassium: 4.3 meq/L (ref 3.5–5.1)
Sodium: 140 meq/L (ref 135–145)
Total Bilirubin: 0.5 mg/dL (ref 0.2–1.2)
Total Protein: 8.1 g/dL (ref 6.0–8.3)

## 2024-06-09 LAB — CBC WITH DIFFERENTIAL/PLATELET
Basophils Absolute: 0.1 10*3/uL (ref 0.0–0.1)
Basophils Relative: 1 % (ref 0.0–3.0)
Eosinophils Absolute: 0.1 10*3/uL (ref 0.0–0.7)
Eosinophils Relative: 1.5 % (ref 0.0–5.0)
HCT: 41.2 % (ref 36.0–46.0)
Hemoglobin: 13.8 g/dL (ref 12.0–15.0)
Lymphocytes Relative: 24.5 % (ref 12.0–46.0)
Lymphs Abs: 2.5 10*3/uL (ref 0.7–4.0)
MCHC: 33.5 g/dL (ref 30.0–36.0)
MCV: 88.1 fl (ref 78.0–100.0)
Monocytes Absolute: 0.5 10*3/uL (ref 0.1–1.0)
Monocytes Relative: 5.5 % (ref 3.0–12.0)
Neutro Abs: 6.8 10*3/uL (ref 1.4–7.7)
Neutrophils Relative %: 67.5 % (ref 43.0–77.0)
Platelets: 385 10*3/uL (ref 150.0–400.0)
RBC: 4.67 Mil/uL (ref 3.87–5.11)
RDW: 13.3 % (ref 11.5–15.5)
WBC: 10 10*3/uL (ref 4.0–10.5)

## 2024-06-09 LAB — LIPID PANEL
Cholesterol: 228 mg/dL — ABNORMAL HIGH (ref 0–200)
HDL: 50.3 mg/dL (ref >39.00)
LDL Cholesterol: 131 mg/dL — ABNORMAL HIGH (ref 0–99)
NonHDL: 177.8
Total CHOL/HDL Ratio: 5
Triglycerides: 236 mg/dL — ABNORMAL HIGH (ref 0.0–149.0)
VLDL: 47.2 mg/dL — ABNORMAL HIGH (ref 0.0–40.0)

## 2024-06-09 LAB — VITAMIN D 25 HYDROXY (VIT D DEFICIENCY, FRACTURES): VITD: 17.4 ng/mL — ABNORMAL LOW (ref 30.00–100.00)

## 2024-06-09 LAB — TSH: TSH: 2.58 u[IU]/mL (ref 0.35–5.50)

## 2024-06-09 LAB — HEMOGLOBIN A1C: Hgb A1c MFr Bld: 5.9 % (ref 4.6–6.5)

## 2024-06-09 MED ORDER — TRAZODONE HCL 50 MG PO TABS: 50.0000 mg | ORAL_TABLET | Freq: Every evening | ORAL | 5 refills | Status: AC | PRN

## 2024-06-09 MED ORDER — TRIAMCINOLONE ACETONIDE 0.1 % EX CREA: 1.0000 | TOPICAL_CREAM | Freq: Two times a day (BID) | CUTANEOUS | 0 refills | Status: AC

## 2024-06-09 NOTE — Assessment & Plan Note (Signed)
 Chronic Encouraged weight loss Stressed importance of regular exercise Advised decreased calorie intake-decrease portions

## 2024-06-09 NOTE — Assessment & Plan Note (Signed)
 Chronic Taking vitamin D daily Check vitamin D level

## 2024-06-09 NOTE — Assessment & Plan Note (Signed)
 Subacute She does state poor sleep-difficulty falling asleep and staying asleep This does contribute to fatigue Likely also contributes to some irritability and decreased motivation to exercise Discussed some of this is likely related to menopause-discussed she can see her gynecologist and consider hormone replacement Trial of trazodone 50 mg nightly-can adjust the dose if needed Stressed regular exercise Advised to limit caffeine Stressed not sleeping during the day

## 2024-06-09 NOTE — Assessment & Plan Note (Signed)
 Chronic Regular exercise and healthy diet encouraged Check lipid panel, CMP, TSH, CBC Continue lifestyle control

## 2024-06-09 NOTE — Assessment & Plan Note (Addendum)
 Chronic Controlled Following with gastroenterology Likely related to NSAID use-no longer takes any NSAIDs History of esophagitis and gastritis Taking pantoprazole  40 mg daily-she is interested in trying to get off the medication-discussed slowly weaning the medication-discussed may need to add in over-the-counter Pepcid in order to help herself get off medication

## 2024-06-09 NOTE — Assessment & Plan Note (Signed)
 New On right wrist Patch of itchiness, dryness ? Eczema Otc meds have not helped Triamcinolone 0.1% cream bid prn - advised not to use for more than 14 days Use otc moisturizer

## 2024-06-10 ENCOUNTER — Ambulatory Visit: Payer: Self-pay | Admitting: Internal Medicine

## 2024-06-10 ENCOUNTER — Encounter: Payer: Self-pay | Admitting: Internal Medicine

## 2024-06-10 DIAGNOSIS — R7303 Prediabetes: Secondary | ICD-10-CM | POA: Insufficient documentation

## 2024-06-22 ENCOUNTER — Ambulatory Visit

## 2024-06-24 ENCOUNTER — Ambulatory Visit
Admission: RE | Admit: 2024-06-24 | Discharge: 2024-06-24 | Disposition: A | Discharge status: Home / Self Care | Source: Ambulatory Visit | Attending: Internal Medicine | Admitting: Internal Medicine

## 2024-06-24 DIAGNOSIS — Z1231 Encounter for screening mammogram for malignant neoplasm of breast: Secondary | ICD-10-CM | POA: Diagnosis not present

## 2024-06-30 ENCOUNTER — Other Ambulatory Visit: Payer: Self-pay | Admitting: Internal Medicine

## 2024-06-30 ENCOUNTER — Telehealth: Payer: Self-pay | Admitting: Internal Medicine

## 2024-06-30 DIAGNOSIS — R928 Other abnormal and inconclusive findings on diagnostic imaging of breast: Secondary | ICD-10-CM

## 2024-06-30 NOTE — Telephone Encounter (Signed)
 Copied from CRM [id number]. Topic: Clinical - Lab/Test Results >> Jun 30, 2024 11:31 AM Drema MATSU wrote: Reason for CRM: Patient is requesting a callback from the nurse in regards to her imaging results. She had a missed call from the imaging center and they did not leave a voicemail.

## 2024-07-01 NOTE — Telephone Encounter (Signed)
Spoke with patient today and questions answered.

## 2024-07-09 ENCOUNTER — Ambulatory Visit
Admission: RE | Admit: 2024-07-09 | Discharge: 2024-07-09 | Disposition: A | Discharge status: Home / Self Care | Source: Ambulatory Visit | Attending: Internal Medicine | Admitting: Internal Medicine

## 2024-07-09 ENCOUNTER — Ambulatory Visit

## 2024-07-09 DIAGNOSIS — R928 Other abnormal and inconclusive findings on diagnostic imaging of breast: Secondary | ICD-10-CM | POA: Diagnosis not present

## 2024-07-09 DIAGNOSIS — R92312 Mammographic fatty tissue density, left breast: Secondary | ICD-10-CM | POA: Diagnosis not present
